# Patient Record
Sex: Male | Born: 1950
Health system: Southern US, Community
[De-identification: ages and names within clinical notes are randomized; demographics above are authoritative.]

## PROBLEM LIST (undated history)

## (undated) DIAGNOSIS — M1990 Unspecified osteoarthritis, unspecified site: Secondary | ICD-10-CM

## (undated) DIAGNOSIS — E119 Type 2 diabetes mellitus without complications: Secondary | ICD-10-CM

## (undated) HISTORY — DX: Unspecified osteoarthritis, unspecified site: M19.90

## (undated) HISTORY — DX: Type 2 diabetes mellitus without complications: E11.9

## (undated) HISTORY — PX: TOTAL KNEE ARTHROPLASTY: SHX125

---

## 1997-10-07 ENCOUNTER — Ambulatory Visit (HOSPITAL_COMMUNITY): Admission: RE | Admit: 1997-10-07 | Discharge: 1997-10-07 | Payer: Self-pay | Admitting: Podiatry

## 1997-11-04 ENCOUNTER — Ambulatory Visit (HOSPITAL_COMMUNITY): Admission: RE | Admit: 1997-11-04 | Discharge: 1997-11-04 | Payer: Self-pay | Admitting: Podiatry

## 2002-08-16 ENCOUNTER — Emergency Department (HOSPITAL_COMMUNITY): Admission: EM | Admit: 2002-08-16 | Discharge: 2002-08-16 | Payer: Self-pay | Admitting: Emergency Medicine

## 2002-08-16 ENCOUNTER — Encounter: Payer: Self-pay | Admitting: Emergency Medicine

## 2003-08-23 ENCOUNTER — Ambulatory Visit (HOSPITAL_COMMUNITY): Admission: EM | Admit: 2003-08-23 | Discharge: 2003-08-24 | Payer: Self-pay | Admitting: Orthopedic Surgery

## 2009-05-25 ENCOUNTER — Emergency Department (HOSPITAL_COMMUNITY): Admission: EM | Admit: 2009-05-25 | Discharge: 2009-05-25 | Payer: Self-pay | Admitting: Emergency Medicine

## 2010-04-13 DIAGNOSIS — E119 Type 2 diabetes mellitus without complications: Secondary | ICD-10-CM

## 2010-04-13 HISTORY — DX: Type 2 diabetes mellitus without complications: E11.9

## 2010-04-30 ENCOUNTER — Inpatient Hospital Stay (HOSPITAL_COMMUNITY)
Admission: EM | Admit: 2010-04-30 | Discharge: 2010-05-05 | Payer: Self-pay | Source: Home / Self Care | Admitting: Emergency Medicine

## 2010-05-10 ENCOUNTER — Telehealth: Payer: Self-pay | Admitting: Family Medicine

## 2010-05-11 ENCOUNTER — Encounter: Payer: Self-pay | Admitting: *Deleted

## 2010-05-11 ENCOUNTER — Telehealth (INDEPENDENT_AMBULATORY_CARE_PROVIDER_SITE_OTHER): Payer: Self-pay | Admitting: *Deleted

## 2010-05-17 ENCOUNTER — Encounter: Admission: RE | Admit: 2010-05-17 | Discharge: 2010-07-13 | Payer: Self-pay | Source: Home / Self Care

## 2010-05-26 ENCOUNTER — Encounter: Payer: Self-pay | Admitting: Family Medicine

## 2010-05-28 ENCOUNTER — Ambulatory Visit: Payer: Self-pay | Admitting: Family Medicine

## 2010-05-28 DIAGNOSIS — E119 Type 2 diabetes mellitus without complications: Secondary | ICD-10-CM

## 2010-05-28 DIAGNOSIS — E1169 Type 2 diabetes mellitus with other specified complication: Secondary | ICD-10-CM | POA: Insufficient documentation

## 2010-06-28 ENCOUNTER — Encounter: Payer: Self-pay | Admitting: Family Medicine

## 2010-06-29 ENCOUNTER — Ambulatory Visit: Admission: RE | Admit: 2010-06-29 | Discharge: 2010-06-29 | Payer: Self-pay | Source: Home / Self Care

## 2010-06-29 ENCOUNTER — Encounter: Payer: Self-pay | Admitting: Family Medicine

## 2010-06-29 DIAGNOSIS — M25569 Pain in unspecified knee: Secondary | ICD-10-CM | POA: Insufficient documentation

## 2010-06-29 LAB — CONVERTED CEMR LAB
BUN: 15 mg/dL (ref 6–23)
CO2: 26 meq/L (ref 19–32)
Calcium: 9.7 mg/dL (ref 8.4–10.5)
Chloride: 104 meq/L (ref 96–112)
Creatinine, Ser: 1.01 mg/dL (ref 0.40–1.50)
Glucose, Bld: 129 mg/dL — ABNORMAL HIGH (ref 70–99)
Potassium: 4 meq/L (ref 3.5–5.3)
Sodium: 141 meq/L (ref 135–145)

## 2010-07-13 ENCOUNTER — Ambulatory Visit
Admission: RE | Admit: 2010-07-13 | Discharge: 2010-07-13 | Payer: Self-pay | Source: Home / Self Care | Attending: Family Medicine | Admitting: Family Medicine

## 2010-07-13 NOTE — Letter (Signed)
Summary: Generic Letter  [facility]  [address]   [place], [place] [address]   Phone: [phone]  Fax: [phone]    05/11/2010  Calvin Newton [address] [place], [place]  [address]  To whom it may concern,  The above-named patient may return to work without any restrictions. Please contact our office with any questions.         Sincerely,   Garen Grams LPN

## 2010-07-13 NOTE — Progress Notes (Signed)
Summary: phn msg  Phone Note Call from Patient Call back at Home Phone [phone] Call back at [phone]   Caller: Patient Summary of Call: just got out of hosp and wants to know if he can go back to work Initial call taken by: De Nurse,  May 10, 2010 9:58 AM  Follow-up for Phone Call        pt is calling again Follow-up by: De Nurse,  May 10, 2010 4:28 PM  Additional Follow-up for Phone Call Additional follow up Details #1::        Please let patient know he can return to work without any restrictions Additional Follow-up by: Everrett Coombe DO,  May 10, 2010 9:06 PM     Appended Document: phn msg called pt and lmvm to call back

## 2010-07-13 NOTE — Progress Notes (Signed)
Summary: Letter ASAP  Phone Note Call from Patient Call back at [phone]   Reason for Call: Talk to Nurse Summary of Call: pt came into the office asking for a letter stating he may return to work, needs to be on letterhead. pt needs this asap so he can return to work. Initial call taken by: Knox Royalty,  May 11, 2010 12:48 PM  Follow-up for Phone Call        letter done by asha and given to pt on 11/29 Follow-up by: Knox Royalty,  May 12, 2010 11:36 AM

## 2010-07-15 NOTE — Assessment & Plan Note (Signed)
Summary: NP-Hospital f/u DM   Vital Signs:  Patient profile:   60 year old male Weight:      268 pounds Temp:     97.8 degrees F oral Pulse rate:   81 / minute BP sitting:   110 / 79  (left arm) Cuff size:   large  Vitals Entered By: Tessie Fass CMA (May 28, 2010 2:35 PM) CC: hospital f/u Is Patient Diabetic? Yes   CC:  hospital f/u.  History of Present Illness: Patient here for hospital follow-up since being diagnosed with diabetes -Patient feeling much better since glucose under better control.  States he spilled his metformin and does not have anymore, so has not been taking for the past few days.  Patient states sugars range from 66-225 with average 150-170.  Doing well with insulin administration and sliding scale that was prescribed.  Patient currently does not see an eye doctor and not taking an daily aspirin.  Has not had any symptoms with his CBG in the 60's.  Denies shakiness or dizziness.  Decreased urination and thirst since diabetes under better control.  Current Medications (verified): 1)  Metformin Hcl 1000 Mg Tabs (Metformin Hcl) .... Take 1 Tablet By Mouth Two Times A Day 2)  Lantus 100 Unit/ml Soln (Insulin Glargine) .... 40 Units Qhs 3)  Novolog 100 Unit/ml Soln (Insulin Aspart) .... Give Per Sliding Scale 121-150 Give 2 Units, 151-200 Give 3 Units,201-250 Give 5 Units, 251-300 Give 8 Units, 301-350 Give 11 Units, 351-400 Give 15 Units 4)  Lisinopril 5 Mg Tabs (Lisinopril) .Marland Kitchen.. 1 Tab By Mouth Daily 5)  Aspirin 81 Mg Tbec (Aspirin) .Marland Kitchen.. 1 Tab Daily  Physical Exam  General:  Well-developed,well-nourished,in no acute distress; alert,appropriate and cooperative throughout examination Head:  Normocephalic and atraumatic without obvious abnormalities. No apparent alopecia or balding. Lungs:  Normal respiratory effort, chest expands symmetrically. Lungs are clear to auscultation, no crackles or wheezes. Heart:  Normal rate and regular rhythm. S1 and S2 normal  without gallop, murmur, click, rub or other extra sounds. Abdomen:  Bowel sounds positive,abdomen soft and non-tender without masses, organomegaly or hernias noted. Pulses:  R and L carotid,radial,femoral,dorsalis pedis and posterior tibial pulses are full and equal bilaterally Extremities:  No clubbing, cyanosis, edema, or deformity noted with normal full range of motion of all joints.     Impression & Recommendations:  Problem # 1:  DM (ICD-250.00) Pt. doing well with current insulin regimen, has had a couple episodes of low blood sugar, however asymptomatic.  Will decrease his lantus by 10 units since he has not been taking his metformin and get him restarted on metformin with his goal to eventually to be off insulin at some point.  Explained to him the importance of lifestyle changes if he wants to be off insulin.  Educated about what foods to eat and the importance of exercise.  Handouts given.  Also started on low dose ACE-I and baby asa.  Will do diabetic foot exam when he is seen again in one month.  His updated medication list for this problem includes:    Metformin Hcl 1000 Mg Tabs (Metformin hcl) .Marland Kitchen... Take 1 tablet by mouth two times a day    Lantus 100 Unit/ml Soln (Insulin glargine) .Marland KitchenMarland KitchenMarland KitchenMarland Kitchen 40 units qhs    Novolog 100 Unit/ml Soln (Insulin aspart) .Marland Kitchen... Give per sliding scale 121-150 give 2 units, 151-200 give 3 units,201-250 give 5 units, 251-300 give 8 units, 301-350 give 11 units, 351-400 give 15 units  Lisinopril 5 Mg Tabs (Lisinopril) .Marland Kitchen... 1 tab by mouth daily    Aspirin 81 Mg Tbec (Aspirin) .Marland Kitchen... 1 tab daily  Orders: Glucose Cap[facility] ([address])  Complete Medication List: 1)  Metformin Hcl 1000 Mg Tabs (Metformin hcl) .... Take 1 tablet by mouth two times a day 2)  Lantus 100 Unit/ml Soln (Insulin glargine) .... 40 units qhs 3)  Novolog 100 Unit/ml Soln (Insulin aspart) .... Give per sliding scale 121-150 give 2 units, 151-200 give 3 units,201-250 give 5 units, 251-300 give 8  units, 301-350 give 11 units, 351-400 give 15 units 4)  Lisinopril 5 Mg Tabs (Lisinopril) .Marland Kitchen.. 1 tab by mouth daily 5)  Aspirin 81 Mg Tbec (Aspirin) .Marland Kitchen.. 1 tab daily  Patient Instructions: 1)  It was good seeing you again today. 2)  I would like for you to continue on your metformin.  I want you to decrease the amount of lantus you are taking to 40units at night. 3)  Also I would like for you to make an appointment with an eye doctor when you get the opportunity for an eye exam for your diabetes.   4)  I would like to see you back in one month to see how your sugars are doing.  If they are running high >250 on a consistent basis come back to see me sooner. 5)  Also I would like for you to start taking a baby (81mg ) aspirin daily if you are not doing so already. 6)  I am also adding a low dose medication to protect your kidneys with diabetes, I will send this and your metformin prescription to the [facility] on [address].  Prescriptions: LISINOPRIL 5 MG TABS (LISINOPRIL) 1 tab by mouth daily  #30 x 3   Entered and Authorized by:   Everrett Coombe DO   Signed by:   Everrett Coombe DO on 05/28/2010   Method used:   Electronically to        [facility] [phone]* (retail)       [address]       [place], [place]  [address]       Ph: [phone]       Fax: [phone]   RxID:   [id number]    Orders Added: 1)  Glucose Cap[facility] [66440] 2)  [facility] [[address]]  Appended Document: NP-Hospital f/u DM     Current Medications (verified): 1)  Metformin Hcl 1000 Mg Tabs (Metformin Hcl) .... Take 1 Tablet By Mouth Two Times A Day 2)  Lantus 100 Unit/ml Soln (Insulin Glargine) .... 40 Units Qhs 3)  Novolog 100 Unit/ml Soln (Insulin Aspart) .... Give Per Sliding Scale 121-150 Give 2 Units, 151-200 Give 3 Units,201-250 Give 5 Units, 251-300 Give 8 Units, 301-350 Give 11 Units, 351-400 Give 15 Units 4)  Lisinopril 5 Mg Tabs (Lisinopril) .Marland Kitchen.. 1 Tab By Mouth Daily 5)  Aspirin 81 Mg  Tbec (Aspirin) .Marland Kitchen.. 1 Tab Daily  Allergies (verified): No Known Drug Allergies  Past History:  Past Medical History: DMII- 04/2010  Past Surgical History: L Knee replacement  Family History: Mother died at age 78 father died at age 59  Social History: Does not use tobacco, EtOH, or Drugs.  Employed by [employer] and is exposed to the sun frequently   Complete Medication List: 1)  Metformin Hcl 1000 Mg Tabs (Metformin hcl) .... Take 1 tablet by mouth two times a day 2)  Lantus 100 Unit/ml Soln (Insulin glargine) .Marland KitchenMarland KitchenMarland Kitchen  40 units qhs 3)  Novolog 100 Unit/ml Soln (Insulin aspart) .... Give per sliding scale 121-150 give 2 units, 151-200 give 3 units,201-250 give 5 units, 251-300 give 8 units, 301-350 give 11 units, 351-400 give 15 units 4)  Lisinopril 5 Mg Tabs (Lisinopril) .Marland Kitchen.. 1 tab by mouth daily 5)  Aspirin 81 Mg Tbec (Aspirin) .Marland Kitchen.. 1 tab daily

## 2010-07-15 NOTE — Miscellaneous (Signed)
  Clinical Lists Changes  Medications: Added new medication of METFORMIN HCL 1000 MG TABS (METFORMIN HCL) Take 1 tablet by mouth two times a day - Signed Rx of METFORMIN HCL 1000 MG TABS (METFORMIN HCL) Take 1 tablet by mouth two times a day;  #60 x 1;  Signed;  Entered by: Rochele Pages RN;  Authorized by: Everrett Coombe DO;  Method used: Electronically to [facility] [phone]*, [address], [place], [place]  [address], Ph: [phone], Fax: [phone]    Prescriptions: METFORMIN HCL 1000 MG TABS (METFORMIN HCL) Take 1 tablet by mouth two times a day  #60 x 1   Entered by:   Rochele Pages RN   Authorized by:   Everrett Coombe DO   Signed by:   Rochele Pages RN on 05/26/2010   Method used:   Electronically to        [facility] [phone]* (retail)       [address]       [place], [place]  [address]       Ph: [phone]       Fax: [phone]   RxID:   [id number]   Recvd refill request, this med is on pt's discharge summary from the hospital.  Discussed with Dr. [place]- she advised it is ok to send rx.  Rochele Pages RN  May 26, 2010 12:13 PM

## 2010-07-15 NOTE — Miscellaneous (Signed)
Summary: Orders Update  Clinical Lists Changes  Orders: Added new Test order of Basic Met-[facility] ([id number]) - Signed 

## 2010-07-19 ENCOUNTER — Encounter: Payer: Self-pay | Admitting: *Deleted

## 2010-07-21 NOTE — Assessment & Plan Note (Signed)
Summary: F/u DM   Vital Signs:  Patient profile:   60 year old male Weight:      259 pounds Temp:     98.9 degrees F BP sitting:   120 / 82  (right arm) Cuff size:   large  Vitals Entered By: Tessie Fass CMA (June 29, 2010 3:30 PM) CC: F/U DM Is Patient Diabetic? Yes Pain Assessment Patient in pain? no        Diabetic Foot Exam Foot Inspection Is there a history of a foot ulcer?              No Is there a foot ulcer now?              No Is there swelling or an abnormal foot shape?          No Are the toenails long?                Yes Are the toenails thick?                Yes Are the toenails ingrown?              No Is there heavy callous build-up?              No Is there pain in the calf muscle (Intermittent claudication) when walking?    NoIs there a claw toe deformity?              No Is there elevated skin temperature?            No Is there limited ankle dorsiflexion?            No Is there foot or ankle muscle weakness?            No  Diabetic Foot Care Education Patient educated on appropriate care of diabetic feet.  Pulse Check          Right Foot          Left Foot Posterior Tibial:        normal            normal Dorsalis Pedis:        normal            normal Comments: Nails thickened, dark and cracking High Risk Feet? No   10-g (5.07) Semmes-Weinstein Monofilament Test           Right Foot          Left Foot Visual Inspection     normal          Test Control      normal         normal Site 1         normal         normal Site 4         normal         normal Site 5         normal         normal Site 6         normal         normal Site 7         normal         normal Site 8         normal         normal Site 9         normal  normal  Impression      normal         normal   Primary Provider:  Everrett Coombe DO  CC:  F/U DM.  History of Present Illness: Pt. here for f/u  of DM as well as knee pain  1. DM:  Continuing to do well,  sugars averaging 140-150.  No episodes of hypoglycemic symptoms including feeling shaky, confusion, feeling tired.  Has increased exercise currently does one hour of exercise nightly divided into 20 min on stationary bike, 20 minutes on elliptical, and 20 min of free weights. Also continues to improve diet choices and eating more fruits and vegetables.  Has lost 9 lbs since last visit.  No problems with insulin administration at this time but goal is to come off of insulin.  Denies chest pain, shortness of breath, headache, nausea, vomiting or diarrhea.   Taking ACE-I without problems.   2. Knee Pain:  Knee pain is better since weight loss and exercising more.  Is using a muscle rub that seems to help quite a bit.  X-rays done in hospital negative for any bony abnormalities other than minor arthritis.   Current Medications (verified): 1)  Metformin Hcl 1000 Mg Tabs (Metformin Hcl) .... Take 1 Tablet By Mouth Two Times A Day 2)  Lantus 100 Unit/ml Soln (Insulin Glargine) .... 40 Units Qhs 3)  Novolog 100 Unit/ml Soln (Insulin Aspart) .... Give Per Sliding Scale 121-150 Give 2 Units, 151-200 Give 3 Units,201-250 Give 5 Units, 251-300 Give 8 Units, 301-350 Give 11 Units, 351-400 Give 15 Units 4)  Lisinopril 5 Mg Tabs (Lisinopril) .Marland Kitchen.. 1 Tab By Mouth Daily 5)  Aspirin 81 Mg Tbec (Aspirin) .Marland Kitchen.. 1 Tab Daily  Allergies (verified): No Known Drug Allergies  Social History: Does not use tobacco, EtOH, or Drugs.  Employed by [employer] and is exposed to the sun frequently.  Exercising more, 60 minutes total of bike, elliptical and free weights nightly  Review of Systems       Pertinent positives and negatives noted in HPI, Vitals signs noted   Physical Exam  General:  Obese, nad Head:  Normocephalic and atraumatic without obvious abnormalities. Mouth:  Oral mucosa and oropharynx without lesions or exudates.  Teeth in good repair. Lungs:  Normal respiratory effort, chest expands  symmetrically. Lungs are clear to auscultation, no crackles or wheezes. Heart:  Normal rate and regular rhythm. S1 and S2 normal without gallop, murmur, click, rub or other extra sounds. Abdomen:  Bowel sounds positive,abdomen soft and non-tender without masses, organomegaly or hernias noted. Msk:  Knees w/o swelling or ttp.  No crepitus or laxity. Skin:  No rash or suspicious lesions  Diabetes Management Exam:    Foot Exam (with socks and/or shoes not present):       Sensory-Monofilament:          Left foot: normal          Right foot: normal   Impression & Recommendations:  Problem # 1:  DM (ICD-250.00) Assessment Improved Doing well, sugars better controlled, no hypoglycemic symptoms.  Encouraged to continue with daily exercise and healthy food decisions.  Doing great with weight loss.  Toenails with onycholysis recommended  applications of vicks nightly for now.  Will recheck and lipids at next visit.   His updated medication list for this problem includes:    Metformin Hcl 1000 Mg Tabs (Metformin hcl) .Marland Kitchen... Take 1 tablet by mouth two times a day    Lantus 100  Unit/ml Soln (Insulin glargine) .Marland KitchenMarland KitchenMarland KitchenMarland Kitchen 40 units qhs    Novolog 100 Unit/ml Soln (Insulin aspart) .Marland Kitchen... Give per sliding scale 121-150 give 2 units, 151-200 give 3 units,201-250 give 5 units, 251-300 give 8 units, 301-350 give 11 units, 351-400 give 15 units    Lisinopril 5 Mg Tabs (Lisinopril) .Marland Kitchen... 1 tab by mouth daily    Aspirin 81 Mg Tbec (Aspirin) .Marland Kitchen... 1 tab daily  Orders: [facility]- Est Level  3 ([address])Future Orders: Lipid-[facility] ([id number]) ... 09/11/2010 A1C-[facility] (14782) ... 09/11/2010  Problem # 2:  KNEE PAIN, RIGHT (ICD-719.46) Assessment: Improved Improved, encouraged to continue with weight loss and exercise.  May take anti-inflammatory (motrin, etc) if needed. His updated medication list for this problem includes:    Aspirin 81 Mg Tbec (Aspirin) .Marland Kitchen... 1 tab daily  Orders: [facility]- Est Level  3 ([address])  Complete  Medication List: 1)  Metformin Hcl 1000 Mg Tabs (Metformin hcl) .... Take 1 tablet by mouth two times a day 2)  Lantus 100 Unit/ml Soln (Insulin glargine) .... 40 units qhs 3)  Novolog 100 Unit/ml Soln (Insulin aspart) .... Give per sliding scale 121-150 give 2 units, 151-200 give 3 units,201-250 give 5 units, 251-300 give 8 units, 301-350 give 11 units, 351-400 give 15 units 4)  Lisinopril 5 Mg Tabs (Lisinopril) .Marland Kitchen.. 1 tab by mouth daily 5)  Aspirin 81 Mg Tbec (Aspirin) .Marland Kitchen.. 1 tab daily  Patient Instructions: 1)  Nice seeing you again today.  I would like to see you again in one month to check your A1C again.  Also I would like to check your lipids again.  I will put the order in and you can come in one morning before your next appointment before you have eaten.  Keep up the good work with the exercise.  Your weight is looking better and your sugars look good.  Hopefully we can cut back on the insulin after we check your A1C.  For your toenails, you can apply vicks vapor rub to them nightly and put a sock on over top.  This may take a few months to improve.  I will see you again in one month.  take care.   Orders Added: 1)  [facility]- Est Level  3 [99213] 2)  Lipid-[facility] [[id number]] 3)  A1C-[facility] [83036]    Prevention & Chronic Care Immunizations   Influenza vaccine: Not documented    Tetanus booster: Not documented    Pneumococcal vaccine: Not documented  Colorectal Screening   Hemoccult: Not documented    Colonoscopy: Not documented  Other Screening   PSA: Not documented   Smoking status: Not documented  Diabetes Mellitus   HgbA1C: Not documented   Hemoglobin A1C due: 08/27/2010    Eye exam: Not documented    Foot exam: yes  (06/29/2010)   Foot exam action/deferral: Do today   High risk foot: No  (06/29/2010)   Foot care education: Done  (06/29/2010)    Urine microalbumin/creatinine ratio: Not documented    Diabetes flowsheet reviewed?: Yes  Lipids   Total Cholesterol:  Not documented   LDL: Not documented   LDL Direct: Not documented   HDL: Not documented   Triglycerides: Not documented  Self-Management Support :    Diabetes self-management support: Not documented   Nursing Instructions: Diabetic foot exam today

## 2010-07-26 ENCOUNTER — Other Ambulatory Visit: Payer: Self-pay | Admitting: *Deleted

## 2010-07-26 ENCOUNTER — Other Ambulatory Visit (INDEPENDENT_AMBULATORY_CARE_PROVIDER_SITE_OTHER): Payer: BC Managed Care – PPO

## 2010-07-26 DIAGNOSIS — E119 Type 2 diabetes mellitus without complications: Secondary | ICD-10-CM

## 2010-07-26 LAB — LIPID PANEL
LDL Cholesterol: 111 mg/dL — ABNORMAL HIGH (ref 0–99)
Triglycerides: 88 mg/dL (ref <150)
VLDL: 18 mg/dL (ref 0–40)

## 2010-07-26 MED ORDER — INSULIN PEN NEEDLE 29G X 13MM MISC
1.0000 | Freq: Three times a day (TID) | Status: DC
Start: 2010-07-26 — End: 2010-07-26

## 2010-07-26 MED ORDER — INSULIN PEN NEEDLE 29G X 13MM MISC: 1.0000 | Freq: Four times a day (QID) | Status: DC

## 2010-07-30 ENCOUNTER — Ambulatory Visit (INDEPENDENT_AMBULATORY_CARE_PROVIDER_SITE_OTHER): Payer: BC Managed Care – PPO | Admitting: Family Medicine

## 2010-07-30 ENCOUNTER — Encounter: Payer: Self-pay | Admitting: Family Medicine

## 2010-07-30 VITALS — BP 104/66 | HR 81 | Temp 98.1°F | Wt 256.0 lb

## 2010-07-30 DIAGNOSIS — E119 Type 2 diabetes mellitus without complications: Secondary | ICD-10-CM

## 2010-07-30 DIAGNOSIS — E781 Pure hyperglyceridemia: Secondary | ICD-10-CM

## 2010-07-30 NOTE — Patient Instructions (Signed)
It was good seeing you again today.  For your insulin, I would like for you to stop taking your novolog.  I would also like for you to reduce your lantus to 30 units before bed.  I know your goal is to get off insulin so we will try to continue and reduce your dosage as tolerated.  Remember to keep a good record of your sugars and bring them with you when you come back.  I would like to see you back in 3-4 weeks.

## 2010-08-03 ENCOUNTER — Encounter: Payer: Self-pay | Admitting: Family Medicine

## 2010-08-03 DIAGNOSIS — E781 Pure hyperglyceridemia: Secondary | ICD-10-CM | POA: Insufficient documentation

## 2010-08-03 NOTE — Progress Notes (Signed)
SUBJECTIVE: 60 y.o. male for follow up of diabetes. Diabetic Review of Systems - medication compliance: compliant all of the time, diabetic diet compliance: compliant all of the time, home glucose monitoring: is performed regularly, is getting numbers in the low 100s and occasionally down in the 80's, no symptoms of hypoglycemia including sweating, jitteriness.  Is continuing to work out after work ~1-1.5 hours doing stationary bike, elliptical and free weights.  Continues to make dietary changes as well, has incorporated more vegetables into his diet and is trying to avoid sweets and processed carbs    OBJECTIVE: Appearance: alert, well appearing, and in no distress, oriented to person, place, and time, overweight and well hydrated. BP 104/66  Pulse 81  Temp(Src) 98.1 F (36.7 C) (Oral)  Wt 256 lb (116.121 kg)  Exam: heart sounds normal rate, regular rhythm, normal S1, S2, no murmurs, rubs, clicks or gallops, chest clear, no hepatosplenomegaly   .

## 2010-08-03 NOTE — Assessment & Plan Note (Addendum)
ASSESSMENT: Diabetes Mellitus: a1c shows much better controlled and getting some low numbers and not requiring SSI anymore, will d/c.  Will decrease lantus to 30 units, and have him monitor his sugar over the next month to see if we can continue to decrease.  As noted before, goal is to possibly get off insulin.  Patient working Midwife with diet and exercise to achieve this, explained to him that may not be possible but will continue to monitor progess

## 2010-08-03 NOTE — Assessment & Plan Note (Signed)
Much improved on last labwork, likely 2/2 to uncontrolled diabetes previously.

## 2010-08-03 NOTE — Progress Notes (Deleted)
  Subjective:    Patient ID: Calvin Newton, male    DOB: 04/29/1951, 60 y.o.   MRN: [id number]  Diabetes      Review of Systems     Objective:   Physical Exam        Assessment & Plan:

## 2010-08-17 ENCOUNTER — Telehealth: Payer: Self-pay | Admitting: Family Medicine

## 2010-08-17 NOTE — Telephone Encounter (Signed)
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 Please advise.

## 2010-08-18 NOTE — Telephone Encounter (Signed)
Please call patient and have them make an appointment with me with me to see if we can get him transitioned to orals.  Thanks

## 2010-08-19 ENCOUNTER — Telehealth: Payer: Self-pay | Admitting: *Deleted

## 2010-08-19 NOTE — Telephone Encounter (Signed)
Pt has upcoming appt tomorrow to discuss change of medications. Calvin Newton

## 2010-08-19 NOTE — Telephone Encounter (Signed)
Called pt and lmvm to call us back. Waiting for call back. Pt needs OV to discuss medication change Calvin Newton

## 2010-08-20 ENCOUNTER — Encounter: Payer: Self-pay | Admitting: Family Medicine

## 2010-08-20 ENCOUNTER — Ambulatory Visit (INDEPENDENT_AMBULATORY_CARE_PROVIDER_SITE_OTHER): Payer: BC Managed Care – PPO | Admitting: Family Medicine

## 2010-08-20 ENCOUNTER — Other Ambulatory Visit: Payer: Self-pay | Admitting: Family Medicine

## 2010-08-20 VITALS — BP 104/70 | Temp 97.9°F | Ht 71.0 in | Wt 255.0 lb

## 2010-08-20 DIAGNOSIS — E119 Type 2 diabetes mellitus without complications: Secondary | ICD-10-CM

## 2010-08-20 MED ORDER — GLIPIZIDE 10 MG PO TABS: 10.0000 mg | ORAL_TABLET | Freq: Two times a day (BID) | ORAL | Status: DC

## 2010-08-20 MED ORDER — SITAGLIPTIN PHOSPHATE 100 MG PO TABS: 100.0000 mg | ORAL_TABLET | Freq: Every day | ORAL | Status: DC

## 2010-08-20 NOTE — Patient Instructions (Addendum)
I have sent two prescriptions to your pharmacy for your diabetes.  I want you to stop your lantus.  If you have symptoms of low blood sugar including weakness, sweating, tremor or not feeling well check your sugar and if it is low drink a glass of orange juice or regular soda.  Recheck your sugar in 1 hour.

## 2010-08-23 ENCOUNTER — Telehealth: Payer: Self-pay | Admitting: Family Medicine

## 2010-08-23 DIAGNOSIS — E119 Type 2 diabetes mellitus without complications: Secondary | ICD-10-CM

## 2010-08-23 LAB — GLUCOSE, CAPILLARY: Glucose-Capillary: 100 mg/dL — ABNORMAL HIGH (ref 70–99)

## 2010-08-23 MED ORDER — GLIPIZIDE 10 MG PO TABS: 10.0000 mg | ORAL_TABLET | Freq: Every day | ORAL | Status: DC

## 2010-08-23 NOTE — Telephone Encounter (Signed)
His BS is now 48 and is needing to know what to do.  Was told to drinks & eat peanut butter.

## 2010-08-23 NOTE — Telephone Encounter (Signed)
Called patient and left message to take glipizide once daily instead of twice daily.  Told to call if continuing to have lows or any other questions

## 2010-08-24 LAB — CBC
HCT: 35.1 % — ABNORMAL LOW (ref 39.0–52.0)
HCT: 35.6 % — ABNORMAL LOW (ref 39.0–52.0)
HCT: 36.6 % — ABNORMAL LOW (ref 39.0–52.0)
HCT: 45.1 % (ref 39.0–52.0)
Hemoglobin: 12.6 g/dL — ABNORMAL LOW (ref 13.0–17.0)
Hemoglobin: 15.8 g/dL (ref 13.0–17.0)
MCH: 29.3 pg (ref 26.0–34.0)
MCH: 29.5 pg (ref 26.0–34.0)
MCH: 29.8 pg (ref 26.0–34.0)
MCH: 30 pg (ref 26.0–34.0)
MCHC: 33.1 g/dL (ref 30.0–36.0)
MCHC: 33.6 g/dL (ref 30.0–36.0)
MCHC: 34.4 g/dL (ref 30.0–36.0)
MCV: 86.2 fL (ref 78.0–100.0)
MCV: 86.3 fL (ref 78.0–100.0)
MCV: 86.7 fL (ref 78.0–100.0)
Platelets: 103 10*3/uL — ABNORMAL LOW (ref 150–400)
Platelets: 195 10*3/uL (ref 150–400)
Platelets: 231 10*3/uL (ref 150–400)
RBC: 4.6 MIL/uL (ref 4.22–5.81)
RBC: 5.2 MIL/uL (ref 4.22–5.81)
RDW: 13.2 % (ref 11.5–15.5)
RDW: 13.4 % (ref 11.5–15.5)
RDW: 13.5 % (ref 11.5–15.5)
RDW: 13.6 % (ref 11.5–15.5)
WBC: 11 10*3/uL — ABNORMAL HIGH (ref 4.0–10.5)
WBC: 5.3 10*3/uL (ref 4.0–10.5)
WBC: 7.9 10*3/uL (ref 4.0–10.5)

## 2010-08-24 LAB — BASIC METABOLIC PANEL
BUN: 10 mg/dL (ref 6–23)
BUN: 17 mg/dL (ref 6–23)
BUN: 19 mg/dL (ref 6–23)
BUN: 19 mg/dL (ref 6–23)
BUN: 7 mg/dL (ref 6–23)
BUN: 8 mg/dL (ref 6–23)
BUN: 8 mg/dL (ref 6–23)
BUN: 9 mg/dL (ref 6–23)
CO2: 22 mEq/L (ref 19–32)
CO2: 24 mEq/L (ref 19–32)
CO2: 25 mEq/L (ref 19–32)
CO2: 25 mEq/L (ref 19–32)
CO2: 25 mEq/L (ref 19–32)
CO2: 25 mEq/L (ref 19–32)
CO2: 26 mEq/L (ref 19–32)
CO2: 26 mEq/L (ref 19–32)
Calcium: 10 mg/dL (ref 8.4–10.5)
Calcium: 8.3 mg/dL — ABNORMAL LOW (ref 8.4–10.5)
Calcium: 8.5 mg/dL (ref 8.4–10.5)
Calcium: 8.6 mg/dL (ref 8.4–10.5)
Calcium: 8.6 mg/dL (ref 8.4–10.5)
Calcium: 8.7 mg/dL (ref 8.4–10.5)
Calcium: 8.7 mg/dL (ref 8.4–10.5)
Calcium: 8.8 mg/dL (ref 8.4–10.5)
Calcium: 8.9 mg/dL (ref 8.4–10.5)
Calcium: 9.7 mg/dL (ref 8.4–10.5)
Chloride: 101 mEq/L (ref 96–112)
Chloride: 103 mEq/L (ref 96–112)
Chloride: 103 mEq/L (ref 96–112)
Chloride: 103 mEq/L (ref 96–112)
Creatinine, Ser: 0.67 mg/dL (ref 0.4–1.5)
Creatinine, Ser: 0.68 mg/dL (ref 0.4–1.5)
Creatinine, Ser: 0.7 mg/dL (ref 0.4–1.5)
Creatinine, Ser: 0.72 mg/dL (ref 0.4–1.5)
Creatinine, Ser: 0.76 mg/dL (ref 0.4–1.5)
Creatinine, Ser: 0.8 mg/dL (ref 0.4–1.5)
Creatinine, Ser: 0.85 mg/dL (ref 0.4–1.5)
Creatinine, Ser: 0.98 mg/dL (ref 0.4–1.5)
Creatinine, Ser: 1.04 mg/dL (ref 0.4–1.5)
Creatinine, Ser: 1.14 mg/dL (ref 0.4–1.5)
GFR calc Af Amer: >60 mL/min (ref >60)
GFR calc Af Amer: >60 mL/min (ref >60)
GFR calc Af Amer: >60 mL/min (ref >60)
GFR calc Af Amer: >60 mL/min (ref >60)
GFR calc Af Amer: >60 mL/min (ref >60)
GFR calc Af Amer: >60 mL/min (ref >60)
GFR calc Af Amer: >60 mL/min (ref >60)
GFR calc non Af Amer: 59 mL/min — ABNORMAL LOW (ref >60)
GFR calc non Af Amer: >60 mL/min (ref >60)
GFR calc non Af Amer: >60 mL/min (ref >60)
GFR calc non Af Amer: >60 mL/min (ref >60)
GFR calc non Af Amer: >60 mL/min (ref >60)
GFR calc non Af Amer: >60 mL/min (ref >60)
GFR calc non Af Amer: >60 mL/min (ref >60)
GFR calc non Af Amer: >60 mL/min (ref >60)
GFR calc non Af Amer: >60 mL/min (ref >60)
GFR calc non Af Amer: >60 mL/min (ref >60)
GFR calc non Af Amer: >60 mL/min (ref >60)
Glucose, Bld: 137 mg/dL — ABNORMAL HIGH (ref 70–99)
Glucose, Bld: 143 mg/dL — ABNORMAL HIGH (ref 70–99)
Glucose, Bld: 220 mg/dL — ABNORMAL HIGH (ref 70–99)
Glucose, Bld: 230 mg/dL — ABNORMAL HIGH (ref 70–99)
Glucose, Bld: 235 mg/dL — ABNORMAL HIGH (ref 70–99)
Glucose, Bld: 322 mg/dL — ABNORMAL HIGH (ref 70–99)
Glucose, Bld: 342 mg/dL — ABNORMAL HIGH (ref 70–99)
Glucose, Bld: 368 mg/dL — ABNORMAL HIGH (ref 70–99)
Potassium: 3.5 mEq/L (ref 3.5–5.1)
Potassium: 3.7 mEq/L (ref 3.5–5.1)
Potassium: 4 mEq/L (ref 3.5–5.1)
Potassium: 4.8 mEq/L (ref 3.5–5.1)
Sodium: 130 mEq/L — ABNORMAL LOW (ref 135–145)
Sodium: 130 mEq/L — ABNORMAL LOW (ref 135–145)
Sodium: 130 mEq/L — ABNORMAL LOW (ref 135–145)
Sodium: 132 mEq/L — ABNORMAL LOW (ref 135–145)
Sodium: 132 mEq/L — ABNORMAL LOW (ref 135–145)
Sodium: 133 mEq/L — ABNORMAL LOW (ref 135–145)
Sodium: 135 mEq/L (ref 135–145)
Sodium: 137 mEq/L (ref 135–145)

## 2010-08-24 LAB — GLUCOSE, CAPILLARY
Glucose-Capillary: 142 mg/dL — ABNORMAL HIGH (ref 70–99)
Glucose-Capillary: 145 mg/dL — ABNORMAL HIGH (ref 70–99)
Glucose-Capillary: 146 mg/dL — ABNORMAL HIGH (ref 70–99)
Glucose-Capillary: 165 mg/dL — ABNORMAL HIGH (ref 70–99)
Glucose-Capillary: 167 mg/dL — ABNORMAL HIGH (ref 70–99)
Glucose-Capillary: 183 mg/dL — ABNORMAL HIGH (ref 70–99)
Glucose-Capillary: 191 mg/dL — ABNORMAL HIGH (ref 70–99)
Glucose-Capillary: 192 mg/dL — ABNORMAL HIGH (ref 70–99)
Glucose-Capillary: 199 mg/dL — ABNORMAL HIGH (ref 70–99)
Glucose-Capillary: 200 mg/dL — ABNORMAL HIGH (ref 70–99)
Glucose-Capillary: 202 mg/dL — ABNORMAL HIGH (ref 70–99)
Glucose-Capillary: 209 mg/dL — ABNORMAL HIGH (ref 70–99)
Glucose-Capillary: 209 mg/dL — ABNORMAL HIGH (ref 70–99)
Glucose-Capillary: 212 mg/dL — ABNORMAL HIGH (ref 70–99)
Glucose-Capillary: 217 mg/dL — ABNORMAL HIGH (ref 70–99)
Glucose-Capillary: 218 mg/dL — ABNORMAL HIGH (ref 70–99)
Glucose-Capillary: 225 mg/dL — ABNORMAL HIGH (ref 70–99)
Glucose-Capillary: 226 mg/dL — ABNORMAL HIGH (ref 70–99)
Glucose-Capillary: 227 mg/dL — ABNORMAL HIGH (ref 70–99)
Glucose-Capillary: 231 mg/dL — ABNORMAL HIGH (ref 70–99)
Glucose-Capillary: 233 mg/dL — ABNORMAL HIGH (ref 70–99)
Glucose-Capillary: 236 mg/dL — ABNORMAL HIGH (ref 70–99)
Glucose-Capillary: 236 mg/dL — ABNORMAL HIGH (ref 70–99)
Glucose-Capillary: 236 mg/dL — ABNORMAL HIGH (ref 70–99)
Glucose-Capillary: 237 mg/dL — ABNORMAL HIGH (ref 70–99)
Glucose-Capillary: 242 mg/dL — ABNORMAL HIGH (ref 70–99)
Glucose-Capillary: 242 mg/dL — ABNORMAL HIGH (ref 70–99)
Glucose-Capillary: 248 mg/dL — ABNORMAL HIGH (ref 70–99)
Glucose-Capillary: 251 mg/dL — ABNORMAL HIGH (ref 70–99)
Glucose-Capillary: 254 mg/dL — ABNORMAL HIGH (ref 70–99)
Glucose-Capillary: 260 mg/dL — ABNORMAL HIGH (ref 70–99)
Glucose-Capillary: 263 mg/dL — ABNORMAL HIGH (ref 70–99)
Glucose-Capillary: 267 mg/dL — ABNORMAL HIGH (ref 70–99)
Glucose-Capillary: 270 mg/dL — ABNORMAL HIGH (ref 70–99)
Glucose-Capillary: 274 mg/dL — ABNORMAL HIGH (ref 70–99)
Glucose-Capillary: 278 mg/dL — ABNORMAL HIGH (ref 70–99)
Glucose-Capillary: 283 mg/dL — ABNORMAL HIGH (ref 70–99)
Glucose-Capillary: 286 mg/dL — ABNORMAL HIGH (ref 70–99)
Glucose-Capillary: 290 mg/dL — ABNORMAL HIGH (ref 70–99)
Glucose-Capillary: 291 mg/dL — ABNORMAL HIGH (ref 70–99)
Glucose-Capillary: 292 mg/dL — ABNORMAL HIGH (ref 70–99)
Glucose-Capillary: 294 mg/dL — ABNORMAL HIGH (ref 70–99)
Glucose-Capillary: 301 mg/dL — ABNORMAL HIGH (ref 70–99)
Glucose-Capillary: 308 mg/dL — ABNORMAL HIGH (ref 70–99)
Glucose-Capillary: 311 mg/dL — ABNORMAL HIGH (ref 70–99)
Glucose-Capillary: 312 mg/dL — ABNORMAL HIGH (ref 70–99)
Glucose-Capillary: 319 mg/dL — ABNORMAL HIGH (ref 70–99)
Glucose-Capillary: 326 mg/dL — ABNORMAL HIGH (ref 70–99)
Glucose-Capillary: 326 mg/dL — ABNORMAL HIGH (ref 70–99)
Glucose-Capillary: 329 mg/dL — ABNORMAL HIGH (ref 70–99)
Glucose-Capillary: 335 mg/dL — ABNORMAL HIGH (ref 70–99)
Glucose-Capillary: 336 mg/dL — ABNORMAL HIGH (ref 70–99)
Glucose-Capillary: 338 mg/dL — ABNORMAL HIGH (ref 70–99)
Glucose-Capillary: 339 mg/dL — ABNORMAL HIGH (ref 70–99)
Glucose-Capillary: 343 mg/dL — ABNORMAL HIGH (ref 70–99)
Glucose-Capillary: 349 mg/dL — ABNORMAL HIGH (ref 70–99)
Glucose-Capillary: 361 mg/dL — ABNORMAL HIGH (ref 70–99)
Glucose-Capillary: 362 mg/dL — ABNORMAL HIGH (ref 70–99)
Glucose-Capillary: 375 mg/dL — ABNORMAL HIGH (ref 70–99)
Glucose-Capillary: 414 mg/dL — ABNORMAL HIGH (ref 70–99)
Glucose-Capillary: 434 mg/dL — ABNORMAL HIGH (ref 70–99)
Glucose-Capillary: 505 mg/dL — ABNORMAL HIGH (ref 70–99)
Glucose-Capillary: 508 mg/dL — ABNORMAL HIGH (ref 70–99)
Glucose-Capillary: >600 mg/dL (ref 70–99)

## 2010-08-24 LAB — DIFFERENTIAL
Basophils Absolute: 0 10*3/uL (ref 0.0–0.1)
Basophils Absolute: 0 10*3/uL (ref 0.0–0.1)
Basophils Relative: 0 % (ref 0–1)
Basophils Relative: 1 % (ref 0–1)
Monocytes Absolute: 0.5 10*3/uL (ref 0.1–1.0)
Monocytes Absolute: 0.7 10*3/uL (ref 0.1–1.0)
Neutro Abs: 3.6 10*3/uL (ref 1.7–7.7)
Neutro Abs: 9.1 10*3/uL — ABNORMAL HIGH (ref 1.7–7.7)
Neutrophils Relative %: 60 % (ref 43–77)

## 2010-08-24 LAB — POCT I-STAT 3, VENOUS BLOOD GAS (G3P V)
Acid-base deficit: 8 mmol/L — ABNORMAL HIGH (ref 0.0–2.0)
Bicarbonate: 18.3 meq/L — ABNORMAL LOW (ref 20.0–24.0)
O2 Saturation: 65 %
TCO2: 20 mmol/L (ref 0–100)
pCO2, Ven: 40 mmHg — ABNORMAL LOW (ref 45.0–50.0)
pH, Ven: 7.268 (ref 7.250–7.300)
pO2, Ven: 38 mmHg (ref 30.0–45.0)

## 2010-08-24 LAB — POCT I-STAT, CHEM 8
BUN: 35 mg/dL — ABNORMAL HIGH (ref 6–23)
Calcium, Ion: 1.23 mmol/L (ref 1.12–1.32)
Chloride: 106 mEq/L (ref 96–112)
Creatinine, Ser: 1 mg/dL (ref 0.4–1.5)
Glucose, Bld: 546 mg/dL — ABNORMAL HIGH (ref 70–99)
HCT: 50 % (ref 39.0–52.0)
Hemoglobin: 17 g/dL (ref 13.0–17.0)
Potassium: 5.1 meq/L (ref 3.5–5.1)
Sodium: 131 meq/L — ABNORMAL LOW (ref 135–145)
TCO2: 17 mmol/L (ref 0–100)

## 2010-08-24 LAB — RAPID URINE DRUG SCREEN, HOSP PERFORMED
Amphetamines: NOT DETECTED
Barbiturates: NOT DETECTED
Benzodiazepines: NOT DETECTED
Cocaine: NOT DETECTED
Opiates: NOT DETECTED
Tetrahydrocannabinol: NOT DETECTED

## 2010-08-24 LAB — BASIC METABOLIC PANEL WITH GFR
BUN: 25 mg/dL — ABNORMAL HIGH (ref 6–23)
CO2: 15 meq/L — ABNORMAL LOW (ref 19–32)
Chloride: 102 meq/L (ref 96–112)
Creatinine, Ser: 1.26 mg/dL (ref 0.4–1.5)
Potassium: 5.1 meq/L (ref 3.5–5.1)

## 2010-08-24 LAB — URINE CULTURE
Colony Count: 75000
Culture  Setup Time: 201111191058

## 2010-08-24 LAB — URINALYSIS, ROUTINE W REFLEX MICROSCOPIC
Nitrite: NEGATIVE
Specific Gravity, Urine: 1.036 — ABNORMAL HIGH (ref 1.005–1.030)
Urobilinogen, UA: 0.2 mg/dL (ref 0.0–1.0)

## 2010-08-24 LAB — COMPREHENSIVE METABOLIC PANEL
Albumin: 3.9 g/dL (ref 3.5–5.2)
Alkaline Phosphatase: 127 U/L — ABNORMAL HIGH (ref 39–117)
BUN: 28 mg/dL — ABNORMAL HIGH (ref 6–23)
CO2: 16 mEq/L — ABNORMAL LOW (ref 19–32)
Chloride: 94 mEq/L — ABNORMAL LOW (ref 96–112)
GFR calc non Af Amer: >60 mL/min (ref >60)
Glucose, Bld: 503 mg/dL — ABNORMAL HIGH (ref 70–99)
Potassium: 4.9 mEq/L (ref 3.5–5.1)
Total Bilirubin: 1.1 mg/dL (ref 0.3–1.2)

## 2010-08-24 LAB — URINE MICROSCOPIC-ADD ON

## 2010-08-24 LAB — LIPID PANEL
HDL: 56 mg/dL (ref >39)
Total CHOL/HDL Ratio: 6.8 RATIO
VLDL: UNDETERMINED mg/dL (ref 0–40)

## 2010-08-24 LAB — HEPATITIS PANEL, ACUTE
HCV Ab: NEGATIVE
Hep A IgM: NEGATIVE
Hep B C IgM: NEGATIVE
Hepatitis B Surface Ag: NEGATIVE

## 2010-08-24 LAB — MRSA PCR SCREENING: MRSA by PCR: NEGATIVE

## 2010-08-24 LAB — HEMOGLOBIN A1C: Hgb A1c MFr Bld: 14.3 % — ABNORMAL HIGH (ref <5.7)

## 2010-08-25 ENCOUNTER — Telehealth: Payer: Self-pay | Admitting: Family Medicine

## 2010-08-25 NOTE — Telephone Encounter (Signed)
Next day sugar went to 123 and after that was 88 Pt is at [phone] if you need to talk to him

## 2010-08-25 NOTE — Telephone Encounter (Signed)
Called patient to see how he was doing with change in medication.  Left message for patient to call back and let me know how he was doing

## 2010-08-29 NOTE — Assessment & Plan Note (Addendum)
Requesting that medication be changed to oral meds.  Sugars have been well controlled on insulin and we have talked about eventually transitioning over to orals in the past as long as he continued with exercise and diet changes.  Has to be on orals now due to DOT physical, will d/c Lantus and start him on glipizde and [place].  Given warning signs of glucose being too low and instructed to call me back if he has any of these symptoms so that we can adjust dosage.  Will have him f/u in a couple weeks to see how he is doing on new regiment.  Instructed to record glucose daily until next appointment.

## 2010-08-29 NOTE — Progress Notes (Signed)
  Subjective:    Patient ID: BASIM BARTNIK, male    DOB: May 22, 1951, 60 y.o.   MRN: [id number]  Diabetes   Patient here to talk about switching over to oral diabetes meds and getting off insulin.  States he went to DOT physical and they told him that he should be on oral medications as insulin makes it difficult for him to pass DOT physical.  He states he is still exercising regularly and his sugars are running in the low 100's with many numbers <100.  He denies any symptoms of low blood sugar, chest pain, shakiness, headache.      Review of Systems     Objective:   Physical Exam  Constitutional: He appears well-developed and well-nourished. No distress.  HENT:  Head: Normocephalic and atraumatic.  Cardiovascular: Normal rate and regular rhythm.   Pulmonary/Chest: Effort normal and breath sounds normal.  Musculoskeletal: He exhibits no edema.

## 2010-08-31 ENCOUNTER — Other Ambulatory Visit: Payer: Self-pay | Admitting: *Deleted

## 2010-08-31 MED ORDER — METFORMIN HCL 1000 MG PO TABS: 1000.0000 mg | ORAL_TABLET | Freq: Two times a day (BID) | ORAL | Status: DC

## 2010-09-08 ENCOUNTER — Ambulatory Visit (INDEPENDENT_AMBULATORY_CARE_PROVIDER_SITE_OTHER): Payer: BC Managed Care – PPO | Admitting: Family Medicine

## 2010-09-08 ENCOUNTER — Encounter: Payer: Self-pay | Admitting: Family Medicine

## 2010-09-08 VITALS — BP 145/75 | HR 73 | Temp 98.2°F | Wt 252.0 lb

## 2010-09-08 DIAGNOSIS — E119 Type 2 diabetes mellitus without complications: Secondary | ICD-10-CM

## 2010-09-13 NOTE — Progress Notes (Signed)
  Subjective:    Patient ID: Calvin Newton, male    DOB: 1951/04/01, 60 y.o.   MRN: [id number]  HPI Patient here to follow up on diabetes since transitioning to oral hypoglycemic agents.  Tolerating well did have one low episode initially but glipizide changed and no more episodes of lows.  Blood glucose ranging from 80-130 on his meter, with only one outlier in the low 200s.  Still exercising regularly daily, and doing well with diet changes.   Review of Systems Denies diarrhea, chest pain, n/v, headache, vision change.    Objective:   Physical Exam  Constitutional: He appears well-developed and well-nourished. No distress.  Cardiovascular: Normal rate, regular rhythm and normal heart sounds.   Pulmonary/Chest: Effort normal and breath sounds normal.  Abdominal: Soft. Bowel sounds are normal. He exhibits no distension. There is no tenderness. There is no rebound.  Musculoskeletal: He exhibits no edema.          Assessment & Plan:  See A&P under problem list

## 2010-09-13 NOTE — Assessment & Plan Note (Addendum)
Doing well on current regimen will continue and have him back in 2 months for CPE and reccheck A1C

## 2010-09-14 LAB — WOUND CULTURE

## 2010-10-01 ENCOUNTER — Telehealth: Payer: Self-pay | Admitting: Family Medicine

## 2010-10-01 MED ORDER — LISINOPRIL 5 MG PO TABS: 5.0000 mg | ORAL_TABLET | Freq: Every day | ORAL | Status: DC

## 2010-10-01 MED ORDER — METFORMIN HCL 1000 MG PO TABS: 1000.0000 mg | ORAL_TABLET | Freq: Two times a day (BID) | ORAL | Status: DC

## 2010-10-01 NOTE — Telephone Encounter (Signed)
Pt checking status of rx for lisinopril & metformin before the weekend, Pt goes to [facility]/[address].

## 2010-10-01 NOTE — Telephone Encounter (Signed)
Rx for lisinopril and metformin sent to pharmacy.

## 2010-10-29 NOTE — Op Note (Signed)
NAME:  GEREMY, RISTER                        ACCOUNT NO.:  [id number]   MEDICAL RECORD NO.:  [id number]                   PATIENT TYPE:  OIB   LOCATION:  5007                                 FACILITY:  [facility]   PHYSICIAN:  Harvie Junior, M.D.                DATE OF BIRTH:  Jun 16, 1950   DATE OF PROCEDURE:  08/23/2003  DATE OF DISCHARGE:                                 OPERATIVE REPORT   PREOPERATIVE DIAGNOSIS:  Patellar tendon rupture, left.   POSTOPERATIVE DIAGNOSIS:  Patellar tendon rupture, left.   PRINCIPAL PROCEDURE:  Repair of patellar tendon with three Fibrewire  sutures, #5 centrally, #2 peripherally, through bone drill holes to the  patella.   SURGEON:  Harvie Junior, M.D.   ASSISTANT:  Marshia Ly, P.A.   ANESTHESIA:  General.   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  He was evaluated by  Urgent Care and sent over to evaluate.  At that time, he was noted to have a  big gap between his patella and patellar tendon, and he had a large gap on  the x-ray which showed that his patella was riding significantly superiorly.  At that point, we discussed options with the patient, an ultimately, he  elected to have this fixed which was the appropriate course of action.  He  was brought to the operating room for this procedure.   PROCEDURE:  The patient was brought to the operating room and after adequate  anesthesia was obtained with general anesthetic, the patient was placed upon  the operating table.  The left leg was prepped and draped in the usual  sterile fashion.  Following this, the leg was exsanguinated and the  tourniquet inflated to 350 mmHg.  Following this, a midline incision was  made through the subcutaneous tissue and carried down to the level of the  extensor mechanism.  This was clearly identified.  There was a dramatic  retinacular tear both medially and laterally.   At this point, a #5 Fibrewire  suture was passed in a [place] fashion up the central portion of the tendon,  a #2 medially and laterally.  These sutures were then held out of the way.  Four drill holes were then passed through the patella, and the sutures were  then passed sequentially to tie the patellar tendon back to the patella.  At  this point, prior to closure, the retinaculum was closed with a #1 Vicryl  medially and laterally and once the retinaculum had been closed, the  patellar tendon was advanced into the inferior surface which had been  roughened with the rongeur, and the sutures were tied with the knee held in  60 degrees of flexion.  At this point, the knee was put through a range of  motion and could easily come into 90  degrees of flexion without undue  tension.  At this point, the wound was copiously irrigated and suctioned  dry.  The incision was then closed with 0 and 2-0 Vicryl, and the skin with  skin staples.  Sterile compression was applied.  The patient was placed into  a knee immobilizer and was then taken to the recovery room where she was  noted to be in satisfactory condition.  Estimated blood loss for the  procedure was minimal.                                               Harvie Junior, M.D.   Ranae Plumber  D:  08/23/2003  T:  08/24/2003  Job:  [id number]

## 2011-02-02 ENCOUNTER — Other Ambulatory Visit: Payer: Self-pay | Admitting: Family Medicine

## 2011-02-03 NOTE — Telephone Encounter (Signed)
Refill request

## 2011-02-09 ENCOUNTER — Other Ambulatory Visit: Payer: Self-pay | Admitting: Family Medicine

## 2011-02-09 NOTE — Telephone Encounter (Signed)
Refill request

## 2011-03-08 ENCOUNTER — Other Ambulatory Visit: Payer: Self-pay | Admitting: Family Medicine

## 2011-03-09 NOTE — Telephone Encounter (Signed)
Refill request

## 2011-03-11 ENCOUNTER — Encounter: Payer: BC Managed Care – PPO | Admitting: Family Medicine

## 2011-03-12 ENCOUNTER — Other Ambulatory Visit: Payer: Self-pay | Admitting: Family Medicine

## 2011-03-13 NOTE — Telephone Encounter (Signed)
Refill request

## 2011-03-14 NOTE — Telephone Encounter (Signed)
Needs appt. Prior to next refill

## 2011-03-22 ENCOUNTER — Ambulatory Visit (INDEPENDENT_AMBULATORY_CARE_PROVIDER_SITE_OTHER): Payer: Managed Care, Other (non HMO) | Admitting: Family Medicine

## 2011-03-22 ENCOUNTER — Encounter: Payer: Self-pay | Admitting: Family Medicine

## 2011-03-22 VITALS — BP 111/73 | HR 76 | Temp 98.9°F | Ht 71.5 in | Wt 257.4 lb

## 2011-03-22 DIAGNOSIS — E119 Type 2 diabetes mellitus without complications: Secondary | ICD-10-CM

## 2011-03-22 DIAGNOSIS — Z23 Encounter for immunization: Secondary | ICD-10-CM

## 2011-03-22 NOTE — Progress Notes (Signed)
  Subjective:    Patient ID: Calvin Newton, male    DOB: Jan 30, 1951, 60 y.o.   MRN: [id number]  HPI 1.  DM:  Here to follow up on his diabetes.  Has been a while since his last visit but states he has been doing well.  Is still exercising quite a bit, doing cardio but doing mostly resistance training.  Has been checking sugars and getting numbers in the low 100's.  Highest recently is 195.  Has been getting some numbers as low as into the 60's.  Can not tell when his sugar is this low, usually low in the morning or after exercise.  Denies shakiness, palpitations, sweating.  Would like to come off one of his medications if possible.     Review of Systems Denies chest pain, headache, weakness, n/v/d    Objective:   Physical Exam  Constitutional: He is oriented to person, place, and time. He appears well-developed and well-nourished. No distress.  HENT:  Head: Normocephalic and atraumatic.  Cardiovascular: Normal rate, regular rhythm and normal heart sounds.  Exam reveals no gallop and no friction rub.   No murmur heard. Pulmonary/Chest: Effort normal and breath sounds normal. No respiratory distress.  Musculoskeletal: He exhibits no edema.  Neurological: He is alert and oriented to person, place, and time.  Skin: Skin is warm and dry.  Psychiatric: He has a normal mood and affect. His behavior is normal. Judgment and thought content normal.          Assessment & Plan:

## 2011-03-22 NOTE — Patient Instructions (Signed)
It was good seeing you today.  You are doing great with your diabetes!!  Keep up the exercising I think that has really helped you.  It think you can stop the januvia at this point. I would like to see you back in 3 months. If you notice that your sugars are starting to go up into the 200's or higher please give me a call back.

## 2011-03-28 NOTE — Assessment & Plan Note (Signed)
A1C of 5.6 indicates excellent glycemic control.  I think it may be appropriate to give him a trial off [place] for now, as he wants to take less medications..  Encouraged to keep log over the next two weeks and if he noticed his sugar creeping up to give me a call back.  He will continue his current diet and exercise program as this seems to be working well for him.

## 2011-04-11 ENCOUNTER — Other Ambulatory Visit: Payer: Self-pay | Admitting: Family Medicine

## 2011-04-12 NOTE — Telephone Encounter (Signed)
Refill request

## 2011-04-20 ENCOUNTER — Other Ambulatory Visit: Payer: Self-pay | Admitting: Family Medicine

## 2011-04-22 ENCOUNTER — Telehealth: Payer: Self-pay | Admitting: Family Medicine

## 2011-04-22 NOTE — Telephone Encounter (Signed)
Fwd. To Dr.Matthews for review. .Calvin Newton  

## 2011-04-22 NOTE — Telephone Encounter (Signed)
Mr. Calvin Newton would like to talk to Dr. Denyse Amass about Glucosamine for his joints and red rice yeast for Cholesterol, he wants to know if it is ok for him to use these with his Diabetes.

## 2011-04-26 NOTE — Telephone Encounter (Signed)
Those medications are ok for him to use.

## 2011-04-28 NOTE — Telephone Encounter (Signed)
Waiting for call back. See message. Please let pt know. Lorenda Hatchet, Renato Battles

## 2011-07-12 ENCOUNTER — Other Ambulatory Visit: Payer: Self-pay | Admitting: Family Medicine

## 2011-07-13 NOTE — Telephone Encounter (Signed)
Refill request

## 2011-08-01 ENCOUNTER — Ambulatory Visit (INDEPENDENT_AMBULATORY_CARE_PROVIDER_SITE_OTHER): Payer: Managed Care, Other (non HMO) | Admitting: Family Medicine

## 2011-08-01 ENCOUNTER — Encounter: Payer: Self-pay | Admitting: Family Medicine

## 2011-08-01 VITALS — BP 114/76 | HR 79 | Temp 97.8°F | Ht 72.0 in | Wt 249.5 lb

## 2011-08-01 DIAGNOSIS — E119 Type 2 diabetes mellitus without complications: Secondary | ICD-10-CM

## 2011-08-01 LAB — COMPREHENSIVE METABOLIC PANEL
Albumin: 4.5 g/dL (ref 3.5–5.2)
Alkaline Phosphatase: 67 U/L (ref 39–117)
BUN: 16 mg/dL (ref 6–23)
Calcium: 9.7 mg/dL (ref 8.4–10.5)
Chloride: 106 mEq/L (ref 96–112)
Glucose, Bld: 87 mg/dL (ref 70–99)
Potassium: 4.3 mEq/L (ref 3.5–5.3)

## 2011-08-01 LAB — LDL CHOLESTEROL, DIRECT: Direct LDL: 105 mg/dL — ABNORMAL HIGH

## 2011-08-01 MED ORDER — LOSARTAN POTASSIUM 25 MG PO TABS: 25.0000 mg | ORAL_TABLET | Freq: Every day | ORAL | Status: DC

## 2011-08-01 MED ORDER — METFORMIN HCL 1000 MG PO TABS: 1000.0000 mg | ORAL_TABLET | Freq: Two times a day (BID) | ORAL | Status: DC

## 2011-08-01 NOTE — Patient Instructions (Signed)
Thank you for coming in today, it was good to see you I am changing your lisinopril to a medication to losartan I would like for you to stop your glipizide, but continue to take your metformin Continue to keep up your exercise I will see you back in 3 months.  Please schedule a physical at that time

## 2011-08-04 NOTE — Assessment & Plan Note (Signed)
Still well controlled A1C 5.8, slightly higher than previous.  He is going to switch back to artificial sweetener for his beverages.  I think it is reasonable to d/c his glipizide.  He will watch suagars if running high, instructed him to restart.  He will f/u in 3 months.  Advised about working out in sweat suit so that he does not get dehydrated while on ARB.  Will check lipids, cmet today.

## 2011-08-04 NOTE — Progress Notes (Signed)
  Subjective:    Patient ID: Calvin Newton, male    DOB: 06-10-1951, 61 y.o.   MRN: [id number]  HPI  1. DM:  Here to f/u on DM.  Taking Metformin and glipizide.  Sugars averaging around 100.  Getting many in the 80s-90s.  Highest he has seen is in the 150's.  He is still doing heavy exercise 4-5 days per week and has lost and additional 9 lbs.  He has been working out in sweats to help him lose "water weight".  Would like to get off glipizide.   Review of Systems     Objective:   Physical Exam  Constitutional: He is oriented to person, place, and time. He appears well-nourished. No distress.  Neck: Neck supple.  Cardiovascular: Normal rate, regular rhythm and normal heart sounds.   No murmur heard. Pulmonary/Chest: Effort normal and breath sounds normal. No respiratory distress. He has no wheezes.  Musculoskeletal: He exhibits no edema.  Neurological: He is alert and oriented to person, place, and time.          Assessment & Plan:

## 2011-08-12 ENCOUNTER — Encounter: Payer: Self-pay | Admitting: Family Medicine

## 2011-11-15 ENCOUNTER — Ambulatory Visit (INDEPENDENT_AMBULATORY_CARE_PROVIDER_SITE_OTHER): Payer: Managed Care, Other (non HMO) | Admitting: Family Medicine

## 2011-11-15 ENCOUNTER — Encounter: Payer: Self-pay | Admitting: Family Medicine

## 2011-11-15 VITALS — BP 100/64 | HR 76 | Temp 97.9°F | Ht 72.0 in | Wt 243.0 lb

## 2011-11-15 DIAGNOSIS — E119 Type 2 diabetes mellitus without complications: Secondary | ICD-10-CM

## 2011-11-15 LAB — POCT GLYCOSYLATED HEMOGLOBIN (HGB A1C): Hemoglobin A1C: 6.4

## 2011-11-15 NOTE — Patient Instructions (Signed)
Thank you for coming in today, it was good to see you I would like for you to continue your metformin  I will see you back in 6 months You can try increasing your fiber or try a stool softener such as colace to see if that helps.

## 2011-11-17 NOTE — Progress Notes (Signed)
  Subjective:    Patient ID: Calvin Newton, male    DOB: 23-Sep-1950, 61 y.o.   MRN: [id number]  HPI  1. Follow diabetes: Compliant with his medications.  Continues to exercise and work our regular basis. Weight is down an additional 6 pounds from prior appointment. He is checking his sugars and typically they're around the 130 to 140 range in the morning. Typically in the evening and towards the end of the day his sugar is in the 90-100 range. Did recently take a trip for a couple weeks to [facility] and said that he did not follow his typical diet and  And he did eat quite a few starches during this trip with some occasional blood sugars over 200. He is back on track now. He denies any problems with his medications. He does not report any nausea, vomiting, chest pain, shortness of breath, increased thirst or urination.   Review of Systems  Per history of present illness    Objective:   Physical Exam  Constitutional: He is oriented to person, place, and time. He appears well-developed and well-nourished. No distress.  HENT:  Head: Normocephalic and atraumatic.  Eyes: Conjunctivae are normal. Pupils are equal, round, and reactive to light.  Neck: Neck supple.  Cardiovascular: Normal rate, regular rhythm and normal heart sounds.   Pulmonary/Chest: Effort normal and breath sounds normal.  Musculoskeletal: He exhibits no edema.  Neurological: He is alert and oriented to person, place, and time.          Assessment & Plan:

## 2011-11-17 NOTE — Assessment & Plan Note (Signed)
Hemoglobin A1c elevated from previous appointment. However he still has good control of his diabetes it seems like he is back on track with his diet an exercise regimen. I will make no changes to his medication regimen at this time.  I'll plan to have him back in 6 months for followup given that his diabetes is well controlled. Explain the need for eye exam plans to schedule. Foot exam done today

## 2011-12-02 ENCOUNTER — Other Ambulatory Visit: Payer: Self-pay | Admitting: *Deleted

## 2011-12-02 DIAGNOSIS — E119 Type 2 diabetes mellitus without complications: Secondary | ICD-10-CM

## 2011-12-05 MED ORDER — LOSARTAN POTASSIUM 25 MG PO TABS: 25.0000 mg | ORAL_TABLET | Freq: Every day | ORAL | Status: DC

## 2011-12-05 MED ORDER — METFORMIN HCL 1000 MG PO TABS: 1000.0000 mg | ORAL_TABLET | Freq: Two times a day (BID) | ORAL | Status: DC

## 2012-04-08 ENCOUNTER — Other Ambulatory Visit: Payer: Self-pay | Admitting: Family Medicine

## 2012-05-09 ENCOUNTER — Other Ambulatory Visit: Payer: Self-pay | Admitting: Family Medicine

## 2012-06-11 ENCOUNTER — Ambulatory Visit (INDEPENDENT_AMBULATORY_CARE_PROVIDER_SITE_OTHER): Payer: Managed Care, Other (non HMO) | Admitting: Family Medicine

## 2012-06-11 ENCOUNTER — Encounter: Payer: Self-pay | Admitting: Family Medicine

## 2012-06-11 VITALS — BP 119/78 | HR 69 | Ht 72.0 in | Wt 248.0 lb

## 2012-06-11 DIAGNOSIS — E669 Obesity, unspecified: Secondary | ICD-10-CM

## 2012-06-11 DIAGNOSIS — E119 Type 2 diabetes mellitus without complications: Secondary | ICD-10-CM

## 2012-06-11 DIAGNOSIS — R350 Frequency of micturition: Secondary | ICD-10-CM | POA: Insufficient documentation

## 2012-06-11 LAB — POCT GLYCOSYLATED HEMOGLOBIN (HGB A1C): Hemoglobin A1C: 6.4

## 2012-06-11 MED ORDER — LISINOPRIL 10 MG PO TABS: 10.0000 mg | ORAL_TABLET | Freq: Every day | ORAL | Status: DC

## 2012-06-11 NOTE — Patient Instructions (Addendum)
Thank you for coming in today, it was good to see you I will let you know the results of your urine Stop losartan and restart lisinopril Continue metformin, follow up with me in 6 months.

## 2012-06-13 DIAGNOSIS — E669 Obesity, unspecified: Secondary | ICD-10-CM | POA: Insufficient documentation

## 2012-06-13 NOTE — Progress Notes (Signed)
  Subjective:    Patient ID: Calvin Newton, male    DOB: 05/22/1951, 62 y.o.   MRN: [id number]  HPI  1.  DM:  Here for diabetes follow up. CHRONIC DIABETES  Disease Monitoring  Blood Sugar Ranges: 170-190 post prandial, around 100-120 fasting  Polyuria: yes, endorses some increased urination.  Thinks is related to using losartan.  Would  rather have cough with lisinopril  Visual problems: no   Medication Compliance: yes  Medication Side Effects  Hypoglycemia: no   Preventitive Health Care  Eye Exam: Planning on having soon  Foot Exam: Today  Diet pattern: Generally healthy, watches carbs and starchy foods  Exercise: Exercises 1-2 hours per day 5 days per week  2. Obesity:  Exercising frequently and eats pretty healthy.  Does endorse missing some meals occasionally, but is usually pretty consistent.  Feels like weight has been stable.   Review of Systems Per HPI    Objective:   Physical Exam  Constitutional:       Overweight male, nad   HENT:  Head: Normocephalic and atraumatic.  Eyes: Conjunctivae normal are normal.  Cardiovascular: Normal rate and regular rhythm.   Pulmonary/Chest: Effort normal and breath sounds normal. No respiratory distress.  Abdominal: Soft. He exhibits no distension. There is no tenderness.  Musculoskeletal: He exhibits no edema.          Assessment & Plan:

## 2012-06-13 NOTE — Assessment & Plan Note (Signed)
Urinary frequency also with some abdominal fullness.  Check UA.

## 2012-06-13 NOTE — Assessment & Plan Note (Signed)
A1c shows diabetes is well controlled.  Continue current medications.  Will change losartan back to lisinopril at patients request.  Advised continued healthy diet and exercise.  Schedule appt. With eye doctor.   Return in 6 months since has been well controlled.

## 2012-06-13 NOTE — Assessment & Plan Note (Signed)
Weight has been relatively stable.  Discussed continued exercise and healthy eating choices.

## 2012-07-02 ENCOUNTER — Telehealth: Payer: Self-pay | Admitting: Family Medicine

## 2012-07-02 NOTE — Telephone Encounter (Signed)
Called pt. Waiting for call back. (Sorry, but we did not do a UA at his last visit. We can do a UA at his next OV with [place].) .Calvin Newton

## 2012-07-02 NOTE — Telephone Encounter (Signed)
Patient had a urine sample taken back in December and hasn't heard back on the results so he is calling to find out what those results are.  It is ok to leave a message.

## 2012-07-03 NOTE — Telephone Encounter (Signed)
Called pt and advised to have UA done at his next OV. Pt agreed. Lorenda Hatchet, Renato Battles

## 2012-07-11 ENCOUNTER — Other Ambulatory Visit: Payer: Self-pay | Admitting: Family Medicine

## 2012-12-20 ENCOUNTER — Other Ambulatory Visit: Payer: Self-pay

## 2013-04-25 ENCOUNTER — Telehealth: Payer: Self-pay | Admitting: Family Medicine

## 2013-04-25 DIAGNOSIS — E119 Type 2 diabetes mellitus without complications: Secondary | ICD-10-CM

## 2013-04-25 NOTE — Telephone Encounter (Signed)
Will fwd. To PCP for review .Calvin Newton  

## 2013-04-25 NOTE — Telephone Encounter (Signed)
Mr. Marmolejos is scheduled for his yearly phys on 12/16 with provider.  Want a refill on his diabetes med Glipizide.  Was taking it prior to Metformin, but was told by previous provider that if he felt he needed to go back to it would re-activate.  Please send to [facility] on [address].  If there is a problem with this request, please contact patient.

## 2013-04-29 MED ORDER — GLIPIZIDE 5 MG PO TABS: 5.0000 mg | ORAL_TABLET | Freq: Two times a day (BID) | ORAL | Status: DC

## 2013-04-29 NOTE — Telephone Encounter (Signed)
Reviewed patient's record, as I have not seen patient in clinic yet. Previously well controlled DM, with increasing HgbA1c trend 5.8 to 6.4 (2013). Continues Metformin 1000mg  BID, previously on Glipizide 10mg  BID with meals.  Due to upcoming appointment with patient on 05/28/13, I will send in rx for Glipizide 5mg  take 1 tab BID with meals (#60, 0 refills) at patient's request for 1 month, will re-evaluate need for continuing at next appointment. Started lower dose at this time, may need to increase in future.  Saralyn Pilar, DO

## 2013-05-28 ENCOUNTER — Ambulatory Visit (INDEPENDENT_AMBULATORY_CARE_PROVIDER_SITE_OTHER): Payer: 59 | Admitting: Family Medicine

## 2013-05-28 ENCOUNTER — Encounter: Payer: Self-pay | Admitting: Family Medicine

## 2013-05-28 VITALS — BP 127/86 | HR 92 | Temp 98.1°F | Ht 72.0 in | Wt 277.0 lb

## 2013-05-28 DIAGNOSIS — E119 Type 2 diabetes mellitus without complications: Secondary | ICD-10-CM

## 2013-05-28 DIAGNOSIS — G8929 Other chronic pain: Secondary | ICD-10-CM | POA: Insufficient documentation

## 2013-05-28 DIAGNOSIS — R1031 Right lower quadrant pain: Secondary | ICD-10-CM

## 2013-05-28 HISTORY — DX: Other chronic pain: G89.29

## 2013-05-28 LAB — COMPREHENSIVE METABOLIC PANEL
AST: 36 U/L (ref 0–37)
Alkaline Phosphatase: 67 U/L (ref 39–117)
BUN: 14 mg/dL (ref 6–23)
Creat: 1.07 mg/dL (ref 0.50–1.35)

## 2013-05-28 MED ORDER — LISINOPRIL 10 MG PO TABS: 10.0000 mg | ORAL_TABLET | Freq: Every day | ORAL | Status: DC

## 2013-05-28 MED ORDER — METFORMIN HCL 1000 MG PO TABS: ORAL_TABLET | ORAL | Status: DC

## 2013-05-28 NOTE — Assessment & Plan Note (Addendum)
Type 2 DM continues to be well-controlled Current HgbA1c 6.6 (05/28/13), last HgbA1c 6.4 (06/11/12). Meds: Glipizide 5mg  BID No complications or hypoglycemia. HM: Eye Exam (reported 2014, last documented Dr. Hanley Seamen 2012), Foot Exam (performed 05/28/13)  Plan:  1. Restart Metformin 1000mg  BID. Hold Glipizide. 2. Continue Lisinopril 10mg  daily 3. Ordered CMET, Direct LDL 4. Lifestyle Mods - Acknowledges importance of improved DM diet (dec carbs, inc vegs) 5. Goal - inc exercise with indoor stationary bike back up to 3-4x weekly 6. Daily ASA 7. Request Ophtho records, Declined Pneumo vax, RTC 6 months  Future consideration: 1. Plan to discuss statin therapy at next visit

## 2013-05-28 NOTE — Patient Instructions (Signed)
Dear Calvin Newton, Thank you for coming in to clinic today. It was good to meet you!  Today we discussed your Diabetes and Abdominal Discomfort. 1. Your Diabetes appears to be well controlled. However, your A1c has increased from 6.4 to 6.6    - Please take your Metformin and Lisinopril daily. You no longer need to use the Glipizide unless your sugars remain elevated 2. Make sure you continue to increase you exercise, using the indoor bike sounds great at least 3-4 times daily. 3. Please be sure to work on eating healthy, decrease calorie intake, by eating smaller portions with each meal, and increase vegetables. 4. For your abdominal pain, at this point it seems most likely related to your abdominal wall, and not an organ inside your abdomen. Since you have already had a colonoscopy recently, I will send you for an abdominal ultrasound. After this is completed, I would recommend coming back to clinic to discuss results.  Some important numbers from today's visit: HgA1C - 6.6 BP - 127/86  Please schedule a follow-up appointment with me or any available doctor within 1 month, after your Ultrasound is completed.  Please schedule a 6 month follow-up appointment with me for your Diabetes.  If you have any other questions or concerns, please feel free to call the clinic to contact me. You may also schedule an earlier appointment if necessary.  However, if your symptoms get significantly worse, please go to the Emergency Department to seek immediate medical attention.  Saralyn Pilar, DO [facility]

## 2013-05-28 NOTE — Assessment & Plan Note (Addendum)
Unclear etiology of longstanding chronic complaint. Overall clinically non-concerning for acute intra-abdominal process. Suggestive of abdominal wall strain vs defect / herniation, most likely related to significant increased weight gain. No red flag abdominal symptoms. Noted prior GI work-up, recent reported colonoscopy. No documented abd imaging.  Plan: 1. Ordered Right Abdominal limited US to evaluate for any possible abdominal wall defect / herniation, and rule out intra-abdominal process 2. CMET to eval LFTs 3. Advised to continue Aleve if improving symptoms. 4. Document possible triggers and what relieves discomfort. 5. RTC following [place] to discuss

## 2013-05-28 NOTE — Progress Notes (Signed)
Subjective:     Patient ID: Calvin Newton, male   DOB: 1951-02-04, 62 y.o.   MRN: [id number]  HPI  CHRONIC DM, Type 2: Reports that he has run out of most of his medications (including Lisinopril, Metformin, and Glipizide), recently called in for refill on Glipizide because he felt that his sugars were increasing. Acknowledges he has decreased his exercise and has had poor eating habits recently, noted about 30 lb wt gain in 1 year.  Today HgbA1c 6.6 (last 6.4). CBGs: Avg 120s, AM 150s, PM 100s. Checks CBGs 2x daily Meds: Glipizide 5mg  BID with meals. (Previously Metformin 1000mg  BID). No insulin Reports poor compliance. Tolerating well w/o side-effects Currently on ACEi. (ran out x 3-4 months) Complications: none HM: Eye Exam (reported 2014, last record Dr. Hanley Seamen 2014), Foot Exam (05/28/13)  Lifestyle: Diet (unbalanced, increased portions) / Exercise (minimal currently) Denies hypoglycemia, polyuria, visual changes, numbness or tingling.  CHRONIC RIGHT ABDOMINAL PAIN / DISCOMFORT: Reported history of chronic (>10+ yrs) complaint of occasional throbbing / bloated feeling on Right side of abdomen. Duration of episodes variable, discomfort is not constant and sometimes goes away for days at a time. Unable to associate symptoms with activities or eating. No significant relief and unclear on treatments attempted. Notes improvement when applies direct pressure, but denies any bulging or mass. Prior history with work-up at GI clinic, suspected MSK etiology, no imaging done. Suspect may be worse with increased weight this year, however notes previously still present prior to wt gain years ago. Noted colonoscopy (negative) performed several years ago, no record seen in chart. Reports taking Metamucil, Probiotic. Regular BM 1-2x daily.  Social Hx: Never smoker. Noted EtOH use >4x weekly.  Review of Systems  See above HPI.  Admits urinary frequency, weight gain. Denies fever, chills, CP,  dyspnea, nausea / vomiting, constipation, diarrhea, numbness, tingling, weakness.     Objective:   Physical Exam  BP 127/86  Pulse 92  Temp(Src) 98.1 F (36.7 C) (Oral)  Ht 6' (1.829 m)  Wt 277 lb (125.646 kg)  BMI 37.56 kg/m2  General - obese, pleasant and talkative, NAD HEENT - MMM Neck - supple, non-tender, no carotid bruits Heart - RRR, no murmurs heard Lungs - CTAB. Normal work of breathing Abd - significant central adiposity, soft, NTND, no rebound or guarding, Carnett's test without change in abdominal discomfort, no mass palpated, no obvious abd wall herniation, +active BS. No aortic bruit heard, without pulsatile mass Genital - no inguinal hernia b/l Ext - Left knee scar noted, non-tender, no edema, +2 peripheral pulses Neuro - AAOx3, grossly non-focal     Assessment:     See specific A&P problem list for details.      Plan:     See specific A&P problem list for details.

## 2013-06-20 ENCOUNTER — Encounter: Payer: Self-pay | Admitting: Family Medicine

## 2013-06-25 ENCOUNTER — Telehealth: Payer: Self-pay | Admitting: Family Medicine

## 2013-06-25 NOTE — Telephone Encounter (Signed)
Pt called and wanted to know about her referral. He has an appointment on 1/20 and this appointment was to go over his radiology results and he has not been called for an appointment. jw

## 2013-06-27 ENCOUNTER — Ambulatory Visit: Payer: Managed Care, Other (non HMO) | Admitting: Family Medicine

## 2013-06-27 NOTE — Telephone Encounter (Signed)
Attempted to call patient at the number left but it is not working. 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 Also the [employer] is not valid as his insurance anymore and it is [employer], no card on file/scanned in, cannot make appointment. I understand that patient is wanting this done before his appointment on 1/20, but without proper information I cannot prior approve so that this can be don, cannot contact patient also.Erma PintoEvans, Karin Pinedo Kristin

## 2013-07-01 NOTE — Telephone Encounter (Signed)
Pt called about the reason his MRI wasn't scheduled. After reading him the previous note, both home and work phone numbers were changed. He was told to bring in his [facility] insurance, have it scanned into his file and we would then schedule the MRI

## 2013-07-02 ENCOUNTER — Ambulatory Visit: Payer: 59 | Admitting: Family Medicine

## 2013-07-02 NOTE — Telephone Encounter (Signed)
I don't process MRI referral [facility]  [place]

## 2013-12-03 ENCOUNTER — Other Ambulatory Visit: Payer: Self-pay | Admitting: Family Medicine

## 2013-12-12 NOTE — Telephone Encounter (Signed)
Pt called about refill on metaformin

## 2013-12-31 ENCOUNTER — Encounter: Payer: Self-pay | Admitting: Family Medicine

## 2013-12-31 ENCOUNTER — Ambulatory Visit (INDEPENDENT_AMBULATORY_CARE_PROVIDER_SITE_OTHER): Payer: 59 | Admitting: Family Medicine

## 2013-12-31 VITALS — BP 125/81 | HR 95 | Temp 98.2°F | Resp 18 | Wt 258.0 lb

## 2013-12-31 DIAGNOSIS — G8929 Other chronic pain: Secondary | ICD-10-CM

## 2013-12-31 DIAGNOSIS — E669 Obesity, unspecified: Secondary | ICD-10-CM

## 2013-12-31 DIAGNOSIS — R1031 Right lower quadrant pain: Secondary | ICD-10-CM

## 2013-12-31 DIAGNOSIS — E119 Type 2 diabetes mellitus without complications: Secondary | ICD-10-CM

## 2013-12-31 LAB — POCT GLYCOSYLATED HEMOGLOBIN (HGB A1C): HEMOGLOBIN A1C: 7.4

## 2013-12-31 MED ORDER — METFORMIN HCL 1000 MG PO TABS: 1000.0000 mg | ORAL_TABLET | Freq: Two times a day (BID) | ORAL | Status: DC

## 2013-12-31 MED ORDER — GLIPIZIDE 5 MG PO TABS: 5.0000 mg | ORAL_TABLET | Freq: Every day | ORAL | Status: DC

## 2013-12-31 NOTE — Progress Notes (Signed)
Patient ID: Calvin Newton, male   DOB: 1951/05/03, [name] y.o.   MRN: [id number]  Subjective:   HPI  CHRONIC DM, Type 2: Reports - out of metformin x 1 month (pharmacy would not refill?), CBGs: Avg 140, Low 98, High 180. Checks CBGs 1-2x daily  (did not bring readings in) Meds: Metformin 1000mg  BID, however recently off x 1 month, off Glipizide 5mg  daily (years) Reports good compliance. Tolerating well w/o side-effects Currently on ACEi Denies hypoglycemia, polyuria, visual changes, numbness or tingling.  OBESITY: - Weight loss 20 lb loss intentionally in 7 months - Eating health, inc salads, dec fast food - Active working >50 hours weekly, exercise push-ups and bicycle riding  CHRONIC ABDOMINAL PAIN: - Reported hx chronic >30 years R-mid abdominal pain, discomfort / bloating. Not worse with palpation, unable to determine triggers or what makes worse / better - States symptoms have improved following weight loss - Last OV 05/2013, concern for likely abdominal wall issue / unlikely hernia, ordered US but, stated he did not get US previously as it was unable to get scheduled, insurance issue - Previously worked-up by GI, negative colonoscopy - Takes metamucil - Currently has been relatively milk free diet, drinks coconut milk - Denies diarrhea, constipation, blood in stool, nausea, vomiting, fevers/chills  Social Hx: Never smoker  Review of Systems  See above HPI.     Objective:   Physical Exam  BP 125/81  Pulse 95  Temp(Src) 98.2 F (36.8 C) (Oral)  Resp 18  Wt 258 lb (117.028 kg)  SpO2 95%  General - obese, pleasant, NAD Heart - RRR, no murmurs heard Lungs - CTAB Abd - significant central adiposity, soft, NTND, no rebound or guarding, mild slightly firm area palpated over mid R abdomen (no change on sitting up / bearing down), Murphy's negative, McBurney's negative, Liver scratch test normal limits, spleen non-palpable, +active BS. No aortic bruit heard Ext - non-tender,  no edema, +2 peripheral pulses Neuro - awake, alert  DM Foot Exam - No ulcers / lesions, intact sensation b/l to monofilament     Assessment:     See specific A&P problem list for details.      Plan:     See specific A&P problem list for details.

## 2013-12-31 NOTE — Assessment & Plan Note (Signed)
Appropriate intentional 20 lb wt loss in 7 months  Plan: 1. Continue improved diet / exercise

## 2013-12-31 NOTE — Patient Instructions (Addendum)
Dear Calvin Newton, Thank you for coming in to clinic today. It was good to see you!  Today we discussed your Diabetes and Abdominal Pain. 1. For your Diabetes, unfortunately your 3 month sugar has increased to 7.4. This can be caused by a number of things. Most important I believe we can improve this together. 2. Sent refill for Metformin take 1000mg  tablet twice daily with meals 3. Sent prescription for Glipizide 5mg  - take 1 tab daily with breakfast. We may need to increase this to twice daily in the future. 4. For your abdominal pain, I will order the Abdominal Ultrasound to check for a possible hernia. You will be called with this appointment. Please call our clinic and leave a message for me if you do not hear back within the next 1 month.  Some important numbers from today's visit: HgA1C - 7.4  Please schedule a follow-up appointment with me (Dr. Althea Charon) in 3 months for Diabetes follow-up.  If you have any other questions or concerns, please feel free to call the clinic to contact me. You may also schedule an earlier appointment if necessary.  However, if your symptoms get significantly worse, please go to the Emergency Department to seek immediate medical attention.  Calvin Pilar, DO [facility]   Irritable Bowel Syndrome Irritable bowel syndrome (IBS) is caused by a disturbance of normal bowel function and is a common digestive disorder. You may also hear this condition called spastic colon, mucous colitis, and irritable colon. There is no cure for IBS. However, symptoms often gradually improve or disappear with a good diet, stress management, and medicine. This condition usually appears in late adolescence or early adulthood. Women develop it twice as often as men. CAUSES  After food has been digested and absorbed in the small intestine, waste material is moved into the large intestine, or colon. In the colon, water and salts are absorbed from the  undigested products coming from the small intestine. The remaining residue, or fecal material, is held for elimination. Under normal circumstances, gentle, rhythmic contractions of the bowel walls push the fecal material along the colon toward the rectum. In IBS, however, these contractions are irregular and poorly coordinated. The fecal material is either retained too long, resulting in constipation, or expelled too soon, producing diarrhea. SIGNS AND SYMPTOMS  The most common symptom of IBS is abdominal pain. It is often in the lower left side of the abdomen, but it may occur anywhere in the abdomen. The pain comes from spasms of the bowel muscles happening too much and from the buildup of gas and fecal material in the colon. This pain:  Can range from sharp abdominal cramps to a dull, continuous ache.  Often worsens soon after eating.  Is often relieved by having a bowel movement or passing gas. Abdominal pain is usually accompanied by constipation, but it may also produce diarrhea. The diarrhea often occurs right after a meal or upon waking up in the morning. The stools are often soft, watery, and flecked with mucus. Other symptoms of IBS include:  Bloating.  Loss of appetite.  Heartburn.  Backache.  Dull pain in the arms or shoulders.  Nausea.  Burping.  Vomiting.  Gas. IBS may also cause symptoms that are unrelated to the digestive system, such as:  Fatigue.  Headaches.  Anxiety.  Shortness of breath.  Trouble concentrating.  Dizziness. These symptoms tend to come and go. DIAGNOSIS  The symptoms of IBS may seem like symptoms of other,  more serious digestive disorders. Your health care provider may want to perform tests to exclude these disorders.  TREATMENT Many medicines are available to help correct bowel function or relieve bowel spasms and abdominal pain. Among the medicines available are:  Laxatives for severe constipation and to help restore normal bowel  habits.  Specific antidiarrheal medicines to treat severe or lasting diarrhea.  Antispasmodic agents to relieve intestinal cramps. Your health care provider may also decide to treat you with a mild tranquilizer or sedative during unusually stressful periods in your life. Your health care provider may also prescribe antidepressant medicine. The use of this medicine has been shown to reduce pain and other symptoms of IBS. Remember that if any medicine is prescribed for you, you should take it exactly as directed. Make sure your health care provider knows how well it worked for you. HOME CARE INSTRUCTIONS   Take all medicines as directed by your health care provider.  Avoid foods that are high in fat or oils, such as heavy cream, butter, frankfurters, sausage, and other fatty meats.  Avoid foods that make you go to the bathroom, such as fruit, fruit juice, and dairy products.  Cut out carbonated drinks, chewing gum, and "gassy" foods such as beans and cabbage. This may help relieve bloating and burping.  Eat foods with bran, and drink plenty of liquids with the bran foods. This helps relieve constipation.  Keep track of what foods seem to bring on your symptoms.  Avoid emotionally charged situations or circumstances that produce anxiety.  Start or continue exercising.  Get plenty of rest and sleep. Document Released: 05/30/2005 Document Revised: 06/04/2013 Document Reviewed: 01/18/2008 [facility] Patient Information 2015 [place], [place]. This information is not intended to replace advice given to you by your health care provider. Make sure you discuss any questions you have with your health care provider.  Diet and Irritable Bowel Syndrome  No cure has been found for irritable bowel syndrome (IBS). Many options are available to treat the symptoms. Your caregiver will give you the best treatments available for your symptoms. He or she will also encourage you to manage stress and to make  changes to your diet. You need to work with your caregiver and Registered Dietician to find the best combination of medicine, diet, counseling, and support to control your symptoms. The following are some diet suggestions. FOODS THAT MAKE IBS WORSE  Fatty foods, such as [place] fries.  Milk products, such as cheese or ice cream.  Chocolate.  Alcohol.  Caffeine (found in coffee and some sodas).  Carbonated drinks, such as soda. If certain foods cause symptoms, you should eat less of them or stop eating them. FOOD JOURNAL   Keep a journal of the foods that seem to cause distress. Write down:  What you are eating during the day and when.  What problems you are having after eating.  When the symptoms occur in relation to your meals.  What foods always make you feel badly.  Take your notes with you to your caregiver to see if you should stop eating certain foods. FOODS THAT MAKE IBS BETTER Fiber reduces IBS symptoms, especially constipation, because it makes stools soft, bulky, and easier to pass. Fiber is found in bran, bread, cereal, beans, fruit, and vegetables. Examples of foods with fiber include:  Apples.  Peaches.  Pears.  Berries.  Figs.  Broccoli, raw.  Cabbage.  Carrots.  Raw peas.  Kidney beans.  Lima beans.  Whole-grain bread.  Whole-grain cereal.  Add foods with fiber to your diet a little at a time. This will let your body get used to them. Too much fiber at once might cause gas and swelling of your abdomen. This can trigger symptoms in a person with IBS. Caregivers usually recommend a diet with enough fiber to produce soft, painless bowel movements. High fiber diets may cause gas and bloating. However, these symptoms often go away within a few weeks, as your body adjusts. In many cases, dietary fiber may lessen IBS symptoms, particularly constipation. However, it may not help pain or diarrhea. High fiber diets keep the colon mildly enlarged (distended)  with the added fiber. This may help prevent spasms in the colon. Some forms of fiber also keep water in the stool, thereby preventing hard stools that are difficult to pass.  Besides telling you to eat more foods with fiber, your caregiver may also tell you to get more fiber by taking a fiber pill or drinking water mixed with a special high fiber powder. An example of this is a natural fiber laxative containing psyllium seed.  TIPS  Large meals can cause cramping and diarrhea in people with IBS. If this happens to you, try eating 4 or 5 small meals a day, or try eating less at each of your usual 3 meals. It may also help if your meals are low in fat and high in carbohydrates. Examples of carbohydrates are pasta, rice, whole-grain breads and cereals, fruits, and vegetables.  If dairy products cause your symptoms to flare up, you can try eating less of those foods. You might be able to handle yogurt better than other dairy products, because it contains bacteria that helps with digestion. Dairy products are an important source of calcium and other nutrients. If you need to avoid dairy products, be sure to talk with a Registered Dietitian about getting these nutrients through other food sources.  Drink enough water and fluids to keep your urine clear or pale yellow. This is important, especially if you have diarrhea. FOR MORE INFORMATION  International Foundation for Functional Gastrointestinal Disorders: www.iffgd.org  National Digestive Diseases Information Clearinghouse: digestive.StageSync.si Document Released: 08/20/2003 Document Revised: 08/22/2011 Document Reviewed: 05/07/2007 [facility] Patient Information 2015 [place], [place]. This information is not intended to replace advice given to you by your health care provider. Make sure you discuss any questions you have with your health care provider.   Gluten-Free Diet for Celiac Disease Gluten is a protein found in wheat, rye, barley, and triticale  (a cross between wheat and rye) grains. People with celiac disease need to have a gluten-free diet. With celiac disease, gluten interferes with the absorption of food and may also cause intestinal injury.  Strict compliance is important even during symptom-free periods. This means eliminating all foods with gluten from your diet permanently. This requires some significant changes but is very manageable. WHAT DO I NEED TO KNOW ABOUT A GLUTEN-FREE DIET?  Look for items labeled with "GF." Looking for GF will make it easier to identify products that are safe to eat.  Read all labels. Gluten may have been added as a minor ingredient where least expected, such as in shredded cheeses or ice creams. Always check food labels and investigate questionable ingredients. Talk to your dietitian or health care provider if you have questions about certain foods or need help finding GF foods.  Check when in doubt. If you are not sure whether an ingredient contains gluten, check with the manufacturer. Note that some manufacturers may change  ingredients without notice. Always read labels.   Know how food is prepared. Since flour and cereal products are often used in the preparation of foods, it is important to be aware of the methods of preparation used, as well as the ingredients in the foods themselves. This is especially true when you are dining out. Ask restaurants if they have a gluten-free menu.  Watch for cross-contamination. Cross-contamination occurs when gluten-free foods come into contact with foods that contain gluten. It often happens during the manufacturing process. Always check the ingredient list and for warnings on packages, such as "may contain gluten."  Eat a balanced diet. It is important to still get enough fiber, iron, and B vitamins in your diet. Look for enriched whole grain gluten-free products and continue to eat a well-balanced diet of the important non-grain items, such as vegetables, fruit,  lean proteins, legumes, and dairy.  Consider taking a gluten-free multivitamin and mineral supplement. Discuss this with your health care provider. WHAT KEY WORDS HELP IDENTIFY GLUTEN? Know key words to help identify gluten. A dietitian can help you identify possible harmful ingredients in the foods you normally eat. Words to check for on food labels include:   Flour, enriched flour, bromated flour, white flour, durum flour, graham flour, phosphated flour, self-rising flour, semolina, or farina.  Starch, dextrin, modified food starch, or cereal.  Thickening, fillers, or emulsifiers.  Any kind of malt flavoring, extract, or syrup (malt is made from barley and includes malt vinegar, malted milk, and malted beverages).  Hydrolyzed vegetable protein. WHAT FOODS CAN I EAT? Below is a list of common foods that are allowed with a gluten-free diet.  Grains Products made from the following flours or grains:amaranth,bean flours, 100% buckwheat flour, corn, millet, nut flours or meals, GF oats, quinoa, rice, sorghum, tef, any all-purpose 100% GF flour mix, rice wafers, pure cornmeal tortillas, popcorn, some crackers, some chips, and hot cereals made from cornmeal. Ask your dietitian which specific hot and cold cereals are allowed. Hominy, rice or wild rice, and special GF pasta. Some Asian rice noodles or bean noodles. Arrowroot starch, corn bran, corn flour, corn germ, cornmeal, corn starch, potato flour, potato starch flour, and rice bran. Rice flours: plain, brown, and sweet. Rice polish, soy flour, tapioca starch. Vegetables All plain, fresh, frozen, or canned vegetables.  Fruits All fresh, frozen, canned, dried fruits, and fruit juices.  Meats and Other Protein Foods Meat, fish, poultry, or eggs prepared without added wheat, rye, barley, or triticale. Some luncheon meat and some frankfurters. Pure meat. All aged cheese, most processed cheese products, some cottage cheese, and some cream  cheese. Dried beans, dried peas, and lentils.  Dairy Milk and yogurt made with allowed ingredients.  Beverages Coffee (regular or decaffeinated), tea, herbal tea (read label to be sure that no wheat flour has been added). Carbonated beverages and some root beers. Wine, sake, and distilled spirits, such as gin, vodka, and whiskey. GF beers and GF ciders.  Sweetsand Desserts Sugar, honey, some syrups, molasses, jelly, jam, plain hard candy, marshmallows, gumdrops, homemade candies free of wheat, rye, barley, or triticale. Coconut. Custard, some pudding mixes, and homemade puddings from cornstarch, rice, and tapioca. Gelatin desserts, sorbets, frozen ice pops, and sherbet. Cake, cookies, and other desserts prepared with allowed flours. Some commercial ice creams. Ask your dietitian about specific brands of dessert that are allowed.  Fats and Oils Butter, margarine, vegetable oil, sour cream not containing modified food starch, whipping cream, shortening, lard, cream, and some mayonnaise.  Some commercial salad dressings. Peanut butter.  Other Homemade broth and soups made with allowed ingredients; some canned or frozen soups. Any other combination or prepared foods that do not contain gluten. Monosodium glutamate (MSG). Cider, rice, and wine vinegar. Baking soda and baking powder. Certain soy sauces (Tamari). Ask your dietitian about specific brands that are allowed. Nuts, coconut, chocolate, and pure cocoa powder. Salt, pepper, herbs, spices, extracts, and food colorings. The items listed above may not be a complete list of allowed foods or beverages. Contact your dietitian for more options.  WHAT FOODS CAN I NOT EAT? Below is a list of common foods that are not allowed with a gluten-free diet.  Grains Barley, bran, bulgur, cracked wheat, graham, malt, matzo, wheat germ, and all wheat and rye cereals including spelt and kamut. Avoid cereals containing malt as a flavoring, such as rice cereal. Also  avoid regular noodles, spaghetti, macaroni, and most packaged rice mixes, and all others containing wheat, rye, barley, or triticale.  Vegetables Most creamed vegetables, most vegetables canned in sauces, and any vegetables prepared with wheat, rye, barley, or triticale.  Fruits Thickened or prepared fruits and some pie fillings.  Meats and Other Protein Sources Any meat or meat alternative containing wheat, rye, barley, or gluten stabilizers (such as some hot dogs, salami, cold cuts, or sausage). Bread-containing products, such as Swiss steak, croquettes, and meatloaf. Most tuna canned in vegetable broth, [place] with hydrolyzed vegetable protein (HVP) injected as part of the basting, and any cheese product containing oat gum as an ingredient. Seitan. Imitation fish. Dairy Commercial chocolate milk, which may have cereal added, and malted milk. Beverages Certain cereal beverages. Beer and ciders (unless GF), ale, malted milk, and some root beers. Sweetsand Desserts Commercial candies containing wheat, rye, barley, or triticale. Certain toffees are dusted with wheat flour. Chocolate-coated nuts, which are often rolled in flour. Cakes, cookies, doughnuts, and pastries that are prepared with wheat, barley, rye, or triticale flour. Some commercial ice creams, ice cream flavors which contain cookies, crumbs, or cheesecake. Ice cream cones. Commercially prepared mixes for cakes, cookies, and other desserts unless marked GF. Bread pudding and other puddings thickened with flour. Fats and Oils Some commercial salad dressings and sour cream containing modified food starch.  Condiments Some curry powder, some dry seasoning mixes, some gravy extracts, some meat sauces, some ketchup, some prepared mustard, horseradish. Other All soups containing wheat, rye, barley, or triticale flour. Bouillon and bouillon cubes that contain HVP. Combination or prepared foods that contain gluten. Some soy sauce, some  chip dips, and some chewing gum. Yeast extract (contains barley). Caramel color (may contain malt). The items listed above may not be a complete list of foods and beverages to avoid. Contact your dietitian for more information. Document Released: 05/30/2005 Document Revised: 06/04/2013 Document Reviewed: 04/03/2013 [facility] Patient Information 2015 [place], [place]. This information is not intended to replace advice given to you by your health care provider. Make sure you discuss any questions you have with your health care provider.

## 2013-12-31 NOTE — Assessment & Plan Note (Addendum)
Recent worsening control Current HgbA1c 7.2 (12/31/13), last HgbA1c 6.6 (05/28/13). No complications or hypoglycemia. - Followed by [facility]  Plan:  1. Refilled Metformin 1000mg  BID WC - (unclear why unable to pick-up from pharmacy, advised [facility] leave message next time if about to run out) 2. Start Glipizide 5mg  PO daily with breakfast, consider inc to BID in future if needed 3. Continue improved lifestyle mods - Improved DM diet (dec carbs, inc vegs), exercise (inc frequency) 4. RTC 3 months, repeat A1c, consider statin

## 2013-12-31 NOTE — Assessment & Plan Note (Signed)
Persistent, improved following wt loss. Inc suspicion of IBS (with localized bloating/discomfort symptoms). Previously considered abdominal wall / hernia?, less likely - Chronic >30 yrs - Hx negative colonoscopy - No red flag symptoms - LFTs previously negative  Plan: 1. Monitor. Continue trial lactose free diet 2. Consider trial Gluten free diet x 1 month 3. Hold off on re-ordering Abd US, unlikely hernia 4. Continue Metamucil 5. RTC PRN

## 2014-06-23 ENCOUNTER — Other Ambulatory Visit: Payer: Self-pay | Admitting: Family Medicine

## 2014-06-23 DIAGNOSIS — E119 Type 2 diabetes mellitus without complications: Secondary | ICD-10-CM

## 2014-06-30 ENCOUNTER — Telehealth: Payer: Self-pay | Admitting: Family Medicine

## 2014-07-28 ENCOUNTER — Encounter: Payer: Self-pay | Admitting: Family Medicine

## 2014-08-18 ENCOUNTER — Ambulatory Visit (INDEPENDENT_AMBULATORY_CARE_PROVIDER_SITE_OTHER): Payer: 59 | Admitting: Family Medicine

## 2014-08-18 ENCOUNTER — Encounter: Payer: Self-pay | Admitting: Family Medicine

## 2014-08-18 VITALS — BP 122/71 | HR 99 | Ht 72.0 in | Wt 264.0 lb

## 2014-08-18 DIAGNOSIS — M9902 Segmental and somatic dysfunction of thoracic region: Secondary | ICD-10-CM

## 2014-08-18 DIAGNOSIS — G8929 Other chronic pain: Secondary | ICD-10-CM

## 2014-08-18 DIAGNOSIS — M62838 Other muscle spasm: Secondary | ICD-10-CM

## 2014-08-18 DIAGNOSIS — E119 Type 2 diabetes mellitus without complications: Secondary | ICD-10-CM

## 2014-08-18 DIAGNOSIS — R1031 Right lower quadrant pain: Secondary | ICD-10-CM

## 2014-08-18 LAB — BASIC METABOLIC PANEL
BUN: 20 mg/dL (ref 6–23)
CALCIUM: 9.9 mg/dL (ref 8.4–10.5)
CO2: 27 meq/L (ref 19–32)
Chloride: 101 mEq/L (ref 96–112)
Creat: 1.17 mg/dL (ref 0.50–1.35)
GLUCOSE: 101 mg/dL — AB (ref 70–99)
Potassium: 4.3 mEq/L (ref 3.5–5.3)
Sodium: 135 mEq/L (ref 135–145)

## 2014-08-18 LAB — POCT GLYCOSYLATED HEMOGLOBIN (HGB A1C): Hemoglobin A1C: 6.3

## 2014-08-18 LAB — LDL CHOLESTEROL, DIRECT: LDL DIRECT: 105 mg/dL — AB

## 2014-08-18 MED ORDER — CYCLOBENZAPRINE HCL 5 MG PO TABS: 5.0000 mg | ORAL_TABLET | Freq: Every evening | ORAL | Status: DC | PRN

## 2014-08-18 MED ORDER — LISINOPRIL 10 MG PO TABS
10.0000 mg | ORAL_TABLET | Freq: Every day | ORAL | Status: DC
Start: 2014-08-18 — End: 2015-11-16

## 2014-08-18 NOTE — Progress Notes (Signed)
   Subjective:    Patient ID: Calvin Newton, male    DOB: [birth year], 64 y.o.   MRN: [id number]  Patient presents for annual exam.  HPI  CHRONIC DM, Type 2: Reports no concerns - last DM f/u 12/2013 CBGs: Avg 115, Low 99, High 167. Checks CBGs 2x (fasting AM avg 130s and evening 100s) Meds: Metformin 1000mg  BID and Glipizide 5mg  daily Reports good compliance. Tolerating well w/o side-effects Currently on ACEi Lifestyle: Diet (gluten free, inc fiber, inc salads, reduce carbs) / Exercise (restarted recently with weight training and cardio with stationary bicycle) Denies hypoglycemia, polyuria, visual changes, numbness or tingling.  CHRONIC LOWER ABD PAIN: - Last evaluated 12/2013, thought to be chronic functional problem, possibly IBS - No significant change today. Overall seems improved with gluten free diet and inc fiber (taking Metamucil). Still present on most days, vague description seems like "bloating" or feels like a "bubble" with some discomfort. - Prior history GI work-up >30 years ago and had colonoscopy (reportedly negative >10 years ago) - Denies any new abdominal pain, constipation / diarrhea, blood in stool or rectal bleeding, nausea, vomiting, fevers/chills  UPPER BACK / SHOULDER PAIN, LEFT: - Recent history of gradual worsening Left upper back / shoulder blade pain associated with "muscle spasms". 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 States pain worse with keeping arm in fixed position, improved if raises arm and with heat against sore spot on back. Some relief with ibuprofen. - Denies numbness, weakness, or tingling in either extremity  HM: - Last colonoscopy >10 years ago (no record within Epic chart), performed by Dr. Elnoria HowardHung  I have reviewed and updated the following as appropriate: allergies and current  medications  Social Hx: Never smoker  Review of Systems  See above HPI    Objective:   Physical Exam  BP 142/82 mmHg  Pulse 99  Ht 6' (1.829 m)  Wt 264 lb (119.75 kg)  BMI 35.80 kg/m2  General - obese with large muscular build, well-appearing, NAD Neck - supple, non-tender, full active ROM Heart - RRR, no murmurs heard Abd - significant central adiposity, soft, NTND, no rebound or guarding, +active BS. No aortic bruit MSK - Upper Back: localized +TTP over superior-medial musculature of Left scapula (rhomboids and levator scap) with palpable paraspinal hypertonicity and spasm, full active ROM Left shoulder including arm above head in flexion and internal rotation, negative impingement testing, rotator cuff str intact 5/5 supraspinatus Ext - non-tender, no edema, +2 peripheral pulses Neuro - awake, alert, distal sensation to light touch intact b/l upper ext     Assessment & Plan:   See specific A&P problem list for details.

## 2014-08-18 NOTE — Patient Instructions (Addendum)
Dear Jake SharkHarold, Thank you for coming in to clinic today. It was good to see you!  1. Diabetes is improved - Good job - keep up the good work. 2. For your Shoulder - this is most likely a muscle spasm from overuse (keeping it lifted up while driving)  - take Flexeril 5mg  at bedtime for muscle relaxant (may use 2 tablets if not helping)  - Continue Tylenol / Ibuprofen as needed  - Heating pad, and massage as demonstrated 3. For your Abdominal Pain - continue current diet  - please call Dr. Elnoria HowardHung Dr. Elnoria HowardHung Office - call to schedule Colonoscopy [address], [place], [place] [address]  [phone]  Some important numbers from today's visit: HgA1C - 6.3 (from 7.4) BP - 124/79 Results -  Please schedule a follow-up appointment with 6 months for Diabetes  If you have any other questions or concerns, please feel free to call the clinic to contact me. You may also schedule an earlier appointment if necessary.  However, if your symptoms get significantly worse, please go to the Emergency Department to seek immediate medical attention.  Saralyn PilarAlexander Karamalegos, DO [facility]

## 2014-08-19 ENCOUNTER — Encounter: Payer: Self-pay | Admitting: Family Medicine

## 2014-08-19 DIAGNOSIS — M9902 Segmental and somatic dysfunction of thoracic region: Secondary | ICD-10-CM | POA: Insufficient documentation

## 2014-08-19 NOTE — Assessment & Plan Note (Signed)
Chronic persistent, stable to improved since last visit. Remains unclear etiology, seems functional related to dietary changes. Exam unremarkable.  Plan: 1. Advised to contact Dr. Elnoria HowardHung to see when due for next colonoscopy (reportedly > 10 years ago), also can follow-up with GI to see if has any further input (will go ahead and place referral) 2. Continue current gluten free dietary changes (seemed to improve) 3. Cont Metamucil daily fiber supplement 4. Consider future Abd US if colonoscopy negative and interested in further work-up

## 2014-08-19 NOTE — Assessment & Plan Note (Signed)
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 Improved with heat and ibuprofen.  Plan: 1. Rx Flexeril 5mg  qhs, can increase to 10mg  qhs PRN pain / spasm. 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 2. Demonstrated scapular release myofascial OMT technique for Left Scapula musculature, patient provided verbal consent, treated, and had mild improvement after treatment. 3. Moist heat / heating pad, massage at home, Tylenol / Ibuprofen PRN 4. Recommend adjusting arm position while driving 5. RTC PRN

## 2014-08-19 NOTE — Assessment & Plan Note (Addendum)
Improved control Current HgbA1c 6.3 (08/18/14), last HgbA1c 7.4 (12/31/13). No complications or hypoglycemia.  Plan:  1. Continue current therapy - Metformin 1000mg  BID and Glipizide 5mg  PO daily - pt with goal to eventually reduce medications 2. Lifestyle Mods - continue improved glut free / DM diet (dec carbs, inc vegs), cont regular exercise regimen (wt training and cardio) 3. Check direct LDL today 4. Check BMET - Cr 1.17 stable 5. ASCVD 10 yr Risk (using last lipid profile 2012) >14% - recommend starting statin in setting of DM2 (mod - high intensity). Discussed with patient, to consider and reval at next apt 6. RTC 6 months for next A1c / DM eval

## 2014-08-20 ENCOUNTER — Encounter: Payer: Self-pay | Admitting: Family Medicine

## 2014-09-08 ENCOUNTER — Other Ambulatory Visit: Payer: Self-pay | Admitting: Psychiatry

## 2014-09-08 DIAGNOSIS — R1031 Right lower quadrant pain: Secondary | ICD-10-CM

## 2014-09-16 ENCOUNTER — Encounter: Payer: Self-pay | Admitting: Family Medicine

## 2014-09-16 DIAGNOSIS — K2970 Gastritis, unspecified, without bleeding: Secondary | ICD-10-CM | POA: Insufficient documentation

## 2014-09-18 ENCOUNTER — Other Ambulatory Visit: Payer: Self-pay | Admitting: Family Medicine

## 2014-09-18 DIAGNOSIS — E119 Type 2 diabetes mellitus without complications: Secondary | ICD-10-CM

## 2014-09-22 ENCOUNTER — Ambulatory Visit (HOSPITAL_COMMUNITY): Payer: 59

## 2014-09-23 ENCOUNTER — Ambulatory Visit (HOSPITAL_COMMUNITY): Payer: 59

## 2014-09-30 ENCOUNTER — Ambulatory Visit (HOSPITAL_COMMUNITY)
Admission: RE | Admit: 2014-09-30 | Discharge: 2014-09-30 | Disposition: A | Discharge status: Home / Self Care | Payer: 59 | Source: Ambulatory Visit | Attending: Gastroenterology | Admitting: Gastroenterology

## 2014-09-30 DIAGNOSIS — R1031 Right lower quadrant pain: Secondary | ICD-10-CM | POA: Insufficient documentation

## 2014-09-30 DIAGNOSIS — R911 Solitary pulmonary nodule: Secondary | ICD-10-CM | POA: Diagnosis not present

## 2014-09-30 DIAGNOSIS — I700 Atherosclerosis of aorta: Secondary | ICD-10-CM | POA: Diagnosis not present

## 2014-09-30 MED ORDER — IOHEXOL 300 MG/ML  SOLN
100.0000 mL | Freq: Once | INTRAMUSCULAR | Status: AC | PRN
  Administered 2014-09-30: 100 mL via INTRAVENOUS

## 2014-10-20 ENCOUNTER — Encounter: Payer: Self-pay | Admitting: Family Medicine

## 2015-01-07 ENCOUNTER — Other Ambulatory Visit: Payer: Self-pay | Admitting: Family Medicine

## 2015-01-07 DIAGNOSIS — E119 Type 2 diabetes mellitus without complications: Secondary | ICD-10-CM

## 2015-08-09 ENCOUNTER — Other Ambulatory Visit: Payer: Self-pay | Admitting: Family Medicine

## 2015-08-09 DIAGNOSIS — E119 Type 2 diabetes mellitus without complications: Secondary | ICD-10-CM

## 2015-08-10 ENCOUNTER — Telehealth: Payer: Self-pay | Admitting: *Deleted

## 2015-08-10 DIAGNOSIS — E119 Type 2 diabetes mellitus without complications: Secondary | ICD-10-CM

## 2015-08-10 MED ORDER — GLIPIZIDE 5 MG PO TABS: 5.0000 mg | ORAL_TABLET | Freq: Every day | ORAL | Status: DC

## 2015-08-10 NOTE — Telephone Encounter (Signed)
Refilled Glipizide for #90 pills +1 refill for 90 day supply as requested.  Saralyn Pilar, DO [facility], PGY-3

## 2015-08-10 NOTE — Telephone Encounter (Signed)
Pharmacist from [facility] stating they need a 90 days supply per insurance coverage for glipiZIDE (GLUCOTROL) 5 MG tablet . Clovis Pu, RN

## 2015-08-24 ENCOUNTER — Telehealth: Payer: Self-pay | Admitting: Family Medicine

## 2015-08-24 NOTE — Telephone Encounter (Signed)
A copy of A1c from 08/18/15 faxed to [phone] for patient's job. ROI was completed.

## 2015-08-31 ENCOUNTER — Encounter: Payer: Self-pay | Admitting: Family Medicine

## 2015-08-31 ENCOUNTER — Ambulatory Visit (INDEPENDENT_AMBULATORY_CARE_PROVIDER_SITE_OTHER): Payer: Commercial Managed Care - HMO | Admitting: Family Medicine

## 2015-08-31 VITALS — BP 117/66 | HR 76 | Temp 97.8°F | Ht 72.0 in | Wt 276.6 lb

## 2015-08-31 DIAGNOSIS — Z0000 Encounter for general adult medical examination without abnormal findings: Secondary | ICD-10-CM | POA: Diagnosis not present

## 2015-08-31 DIAGNOSIS — E119 Type 2 diabetes mellitus without complications: Secondary | ICD-10-CM

## 2015-08-31 DIAGNOSIS — E1169 Type 2 diabetes mellitus with other specified complication: Secondary | ICD-10-CM

## 2015-08-31 DIAGNOSIS — E785 Hyperlipidemia, unspecified: Secondary | ICD-10-CM

## 2015-08-31 DIAGNOSIS — Z125 Encounter for screening for malignant neoplasm of prostate: Secondary | ICD-10-CM | POA: Diagnosis not present

## 2015-08-31 DIAGNOSIS — Z114 Encounter for screening for human immunodeficiency virus [HIV]: Secondary | ICD-10-CM | POA: Diagnosis not present

## 2015-08-31 DIAGNOSIS — Z23 Encounter for immunization: Secondary | ICD-10-CM

## 2015-08-31 LAB — POCT GLYCOSYLATED HEMOGLOBIN (HGB A1C): Hemoglobin A1C: 6.7

## 2015-08-31 NOTE — Assessment & Plan Note (Signed)
+  12 lb wt gain in 1 year - Counseled on diet / exercise modifications, goal to inc regular exercise

## 2015-08-31 NOTE — Assessment & Plan Note (Signed)
Not in statin therapy Check fasting lipid panel (future order) RTC 2-4 weeks after lab review results, again recommend statin therapy in DM2

## 2015-08-31 NOTE — Progress Notes (Signed)
Subjective:    Patient ID: Calvin Newton, male    DOB: Oct 30, 1950, 65 y.o.   MRN: [id number]  Calvin LionsHarold C Kreh is a 65 y.o. male presenting on 08/31/2015 for Diabetes and Annual Physical  HPI  CHRONIC DM, Type 2: Reports no concerns today. CBGs: Avg 115, Low 99, High 167. Seems to be highest in AM. Checks CBGs 2x (fasting AM avg 130s and evening 100s) Meds: Metformin 1000mg  BID and Glipizide 5mg  daily Reports good compliance. Tolerating well w/o side-effects Currently on ACEi - Needs ophthalmology apt, saw optometry >1 year ago - Takes ASA 81. Never on statin Denies hypoglycemia, polyuria, visual changes, numbness or tingling.  MORBID OBESITY / HYPERLIPIDEMIA - Weight gain +12 lbs in 1 year Lifestyle: Diet (gluten free, inc fiber, inc salads, reduce carbs) / Exercise (has reduced his exercise regimen recently, only does 3 days a week 1-2x a month, no longer regular weight training and cardio  HM: - Due for colonoscopy, followed by Dr Elnoria HowardHung (patient unsure date of last colonoscopy) - Due for HIV screening - Due for influenza (declined) and pneumonia vaccine (agreed) - Request prostate cancer screening. Denies family or personal history of prostate CA. Has not had DRE before. History of prior PSA but does not recall level. None in chart. Never seen Urology or had biopsy.  Social History  Substance Use Topics  . Smoking status: Never Smoker   . Smokeless tobacco: Never Used  . Alcohol Use: No    Review of Systems Per HPI unless specifically indicated above     Objective:    BP 117/66 mmHg  Pulse 76  Temp(Src) 97.8 F (36.6 C) (Oral)  Ht 6' (1.829 m)  Wt 276 lb 9.6 oz (125.465 kg)  BMI 37.51 kg/m2  Wt Readings from Last 3 Encounters:  08/31/15 276 lb 9.6 oz (125.465 kg)  08/18/14 264 lb (119.75 kg)  12/31/13 258 lb (117.028 kg)    Physical Exam  Constitutional: He appears well-developed and well-nourished. No distress.  Obese, large body habitus with muscular  build, well-appearing, NAD  HENT:  Head: Normocephalic and atraumatic.  Mouth/Throat: Oropharynx is clear and moist.  Eyes: Conjunctivae are normal.  Neck: Normal range of motion. Neck supple. No thyromegaly present.  Cardiovascular: Normal rate, regular rhythm, normal heart sounds and intact distal pulses.   No murmur heard. Pulmonary/Chest: Effort normal and breath sounds normal. No respiratory distress. He has no wheezes. He has no rales.  Abdominal: Soft. Bowel sounds are normal. He exhibits no distension and no mass. There is tenderness (mild right sided discomfort without discrete tenderness). There is no rebound and no guarding.  Obese  Genitourinary:  DRE - normal appearing external anus w/o hemorrhoids, mild-mod enlarged smooth prostate seems uniform without asymmetry, soft without firmness or nodularity, non-tender. No rectal bleeding.  Musculoskeletal: Normal range of motion. He exhibits no edema or tenderness.  Lymphadenopathy:    He has no cervical adenopathy.  Neurological: He is alert.  Skin: Skin is warm and dry. He is not diaphoretic.  Nursing note and vitals reviewed.   DM Foot Exam - bilateral feet intact sensation bilateral to monofilament testing, noted deformity with onychomycosis bilaterally and dry thickened skin without ulceration.   Results for orders placed or performed in visit on 08/31/15  POCT A1C  Result Value Ref Range   Hemoglobin A1C 6.7       Assessment & Plan:   Problem List Items Addressed This Visit    Diabetes type 2, controlled (  HCC)    Well controlled. A1c 6.3 to 6.7. Never on insulin No complications or hypoglycemia.  Plan:  1. Continue - Metformin  BID and Glipizide  PO daily 2. Lifestyle Mods - continue improved glut free / DM diet (dec carbs, inc vegs), need to resume regular exercise regimen 2. Ordered future CMET, lipids 3. On ASA 81, never on statin. Prior ASCVD >10% using old lipid panel results, again recommended  statin, however defer until follow-up lab result visit to discuss 4. Referral to Ophtho DM eye exam 5. RTC 6 months for A1c, follow-up 4 weeks for lab results statin      Relevant Orders   POCT A1C (Completed)   COMPLETE METABOLIC PANEL WITH GFR   Ambulatory referral to Ophthalmology   Hyperlipidemia associated with type 2 diabetes mellitus (HCC)    Not in statin therapy Check fasting lipid panel (future order) RTC 2-4 weeks after lab review results, again recommend statin therapy in DM2      Relevant Orders   Lipid panel   Morbid obesity (HCC)    +12 lb wt gain in 1 year - Counseled on diet / exercise modifications, goal to inc regular exercise      Relevant Orders   Lipid panel   Prostate cancer screening    No concerning history of exam findings. Routine screening in high risk population AA male age >25 - DRE 08/31/15: enlarged smooth prostate  Plan: 1. Check PSA total and free today, counseled on benefits/risks of screening and potential further work-up with Urology if persistent elevated.      Relevant Orders   PSA, total and free    Other Visit Diagnoses    Annual physical exam    -  Primary    Prevnar 13 today, next PPV23 >8 weeks. Declined flu. Advised f/u GI Dr Elnoria Howard for colonoscopy as scheduled.    Screening for HIV (human immunodeficiency virus)        Relevant Orders    HIV antibody    Need for prophylactic vaccination against Streptococcus pneumoniae (pneumococcus)        Relevant Orders    Pneumococcal conjugate vaccine 13-valent (Completed)       No orders of the defined types were placed in this encounter.      Follow up plan: Return in about 4 weeks (around 09/28/2015) for Lab results.  Saralyn Pilar, DO [facility], PGY-3

## 2015-08-31 NOTE — Assessment & Plan Note (Signed)
Well controlled. A1c 6.3 to 6.7. Never on insulin No complications or hypoglycemia.  Plan:  1. Continue - Metformin 1000mg  BID and Glipizide 5mg  PO daily 2. Lifestyle Mods - continue improved glut free / DM diet (dec carbs, inc vegs), need to resume regular exercise regimen 2. Ordered future CMET, lipids 3. On ASA 81, never on statin. Prior ASCVD >10% using old lipid panel results, again recommended statin, however defer until follow-up lab result visit to discuss 4. Referral to Ophtho DM eye exam 5. RTC 6 months for A1c, follow-up 4 weeks for lab results statin

## 2015-08-31 NOTE — Assessment & Plan Note (Addendum)
No concerning history of exam findings. Routine screening in high risk population AA male age 63>50 - DRE 08/31/15: enlarged smooth prostate  Plan: 1. Check PSA total and free today, counseled on benefits/risks of screening and potential further work-up with Urology if persistent elevated.

## 2015-08-31 NOTE — Patient Instructions (Signed)
Thank you for coming in to clinic today.  1. Your Diabetes is well controlled - A1c 6.7 (still at goal below 7) keep up the good work, keep taking your medications, work on improving your regular exercise regimen 2. We will check screening blood work for kidney, liver, prostate, cholesterol  - Please schedule a "Lab Only" visit (early morning, 8 to 9:00am) to get your blood work drawn here at our clinic. - You need to be fasting (No food or drink after midnight, and nothing in the morning before your blood draw). - I have already ordered your blood work (orders will be good for few months), so you may schedule this appointment at your convenience.  You got pneumonia vaccine today  Referral to Eye Doctor today for Diabetic Eye Exam  Recommend future colonoscopy as scheduled with Dr Elnoria HowardHung  I would like you to return for a follow-up visit with me to discuss lab results and cholesterol medication, please schedule this appointment anytime 1-3 weeks after you lab visit  If you have any other questions or concerns, please feel free to call the clinic to contact me. You may also schedule an earlier appointment if necessary.  However, if your symptoms get significantly worse, please go to the Emergency Department to seek immediate medical attention.  Saralyn PilarAlexander Kagen Kunath, DO [facility]

## 2015-09-18 ENCOUNTER — Other Ambulatory Visit: Payer: Self-pay | Admitting: Family Medicine

## 2015-09-18 DIAGNOSIS — E119 Type 2 diabetes mellitus without complications: Secondary | ICD-10-CM

## 2015-11-16 ENCOUNTER — Other Ambulatory Visit: Payer: Self-pay | Admitting: *Deleted

## 2015-11-16 DIAGNOSIS — E119 Type 2 diabetes mellitus without complications: Secondary | ICD-10-CM

## 2015-11-16 MED ORDER — LISINOPRIL 10 MG PO TABS: 10.0000 mg | ORAL_TABLET | Freq: Every day | ORAL | Status: DC

## 2016-01-02 IMAGING — CT CT ABD-PELV W/ CM
2 of 5 series · 16 of 46 positions shown, 18 images · IV contrast (OMNIPAQUE)
Comparison: None.

CLINICAL DATA: Subsequent encounter for several year history of
right lower quadrant abdominal pain.

EXAM:
CT ABDOMEN AND PELVIS WITH CONTRAST
TECHNIQUE: Multidetector CT imaging of the abdomen and pelvis was performed
using the standard protocol following bolus administration of
intravenous contrast.
CONTRAST:  100mL OMNIPAQUE IOHEXOL 300 MG/ML  SOLN

[Series 2: rtn a/p with · axial · 0.77mm/px · z∈[-384,+1]mm · 13 of 89 slices shown, 15 images]
[im 6/89  soft-tissue]
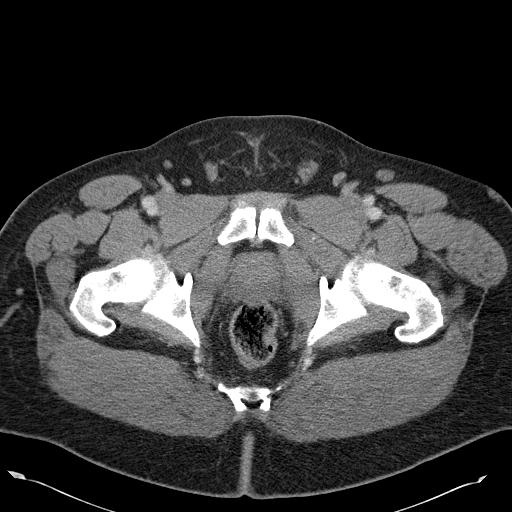
[im 6/89  bone]
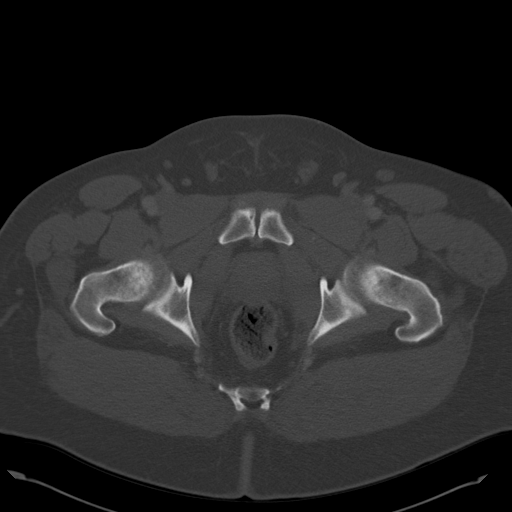
[im 11/89  soft-tissue]
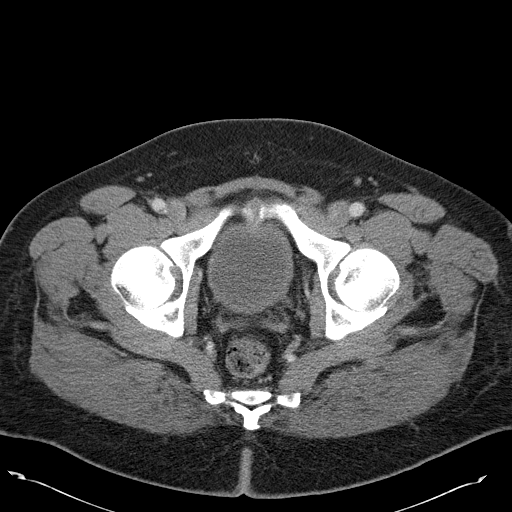
[im 21/89  soft-tissue]
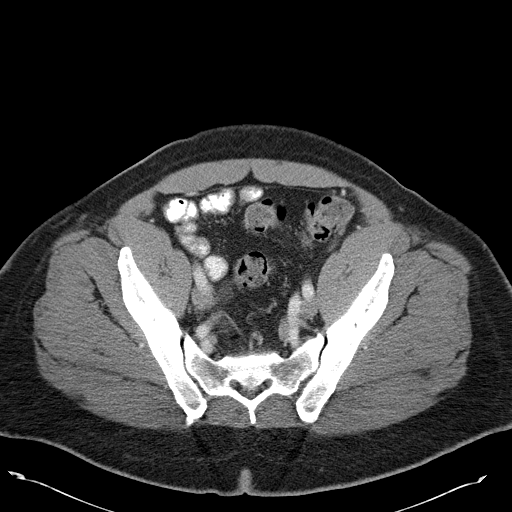
[im 26/89  soft-tissue]
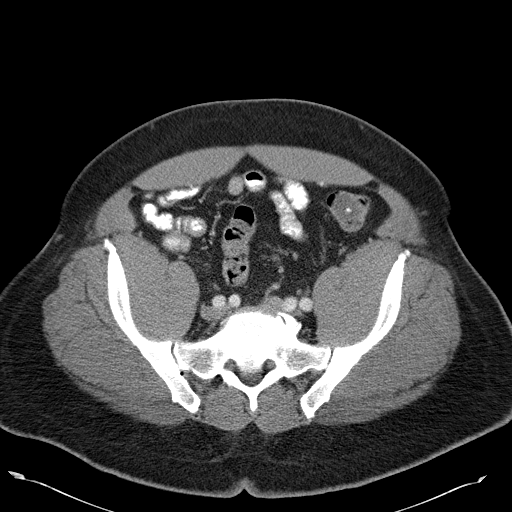
[im 32/89  soft-tissue]
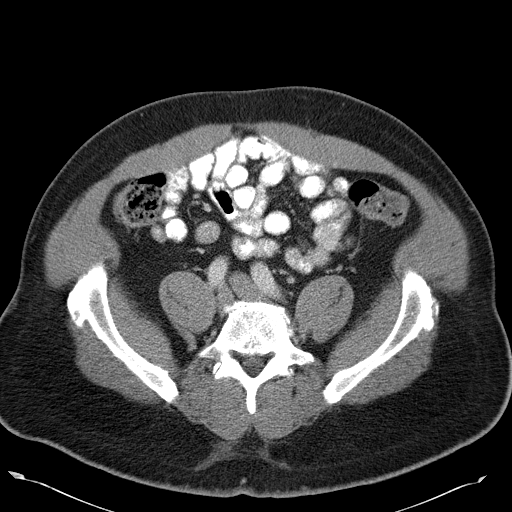
[im 37/89  soft-tissue]
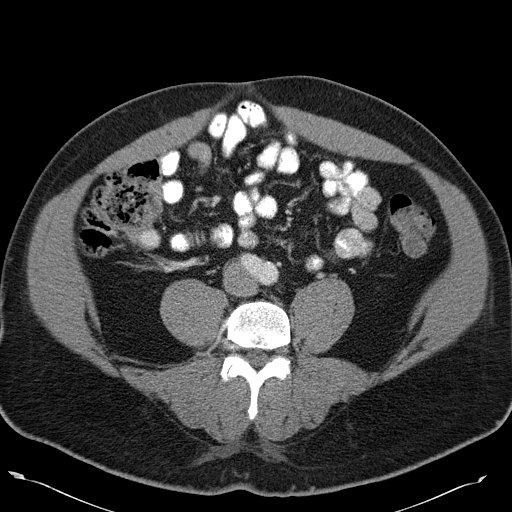
[im 47/89  soft-tissue]
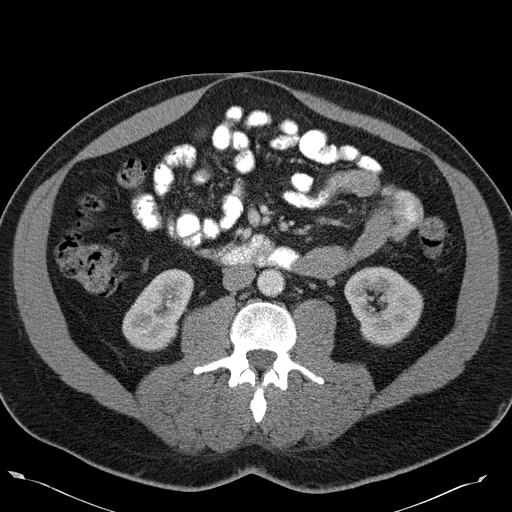
[im 52/89  soft-tissue]
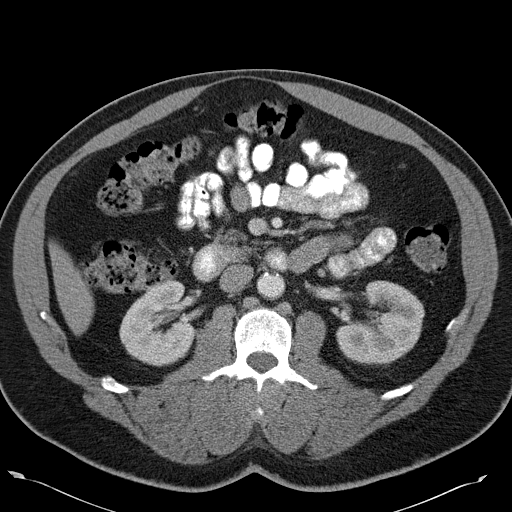
[im 57/89  soft-tissue]
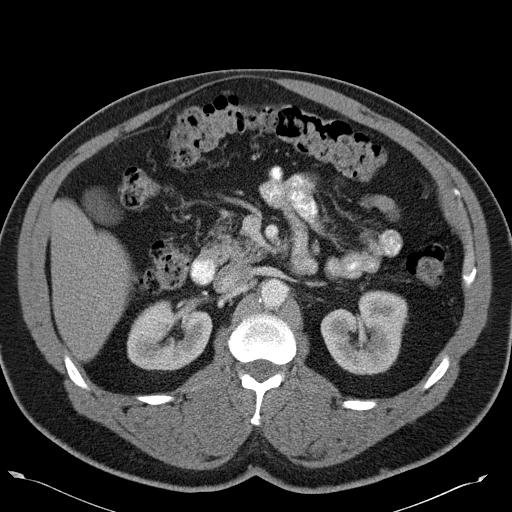
[im 57/89  bone]
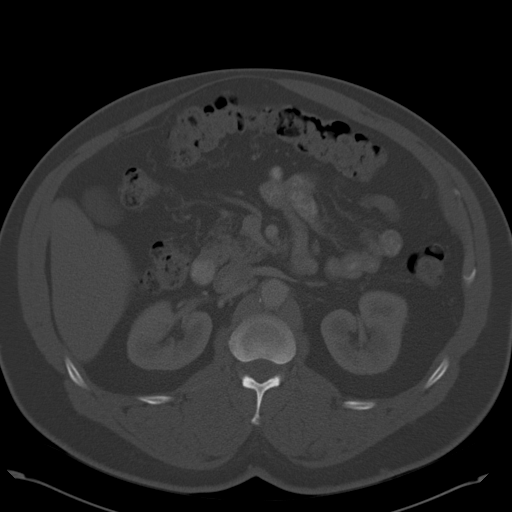
[im 63/89  soft-tissue]
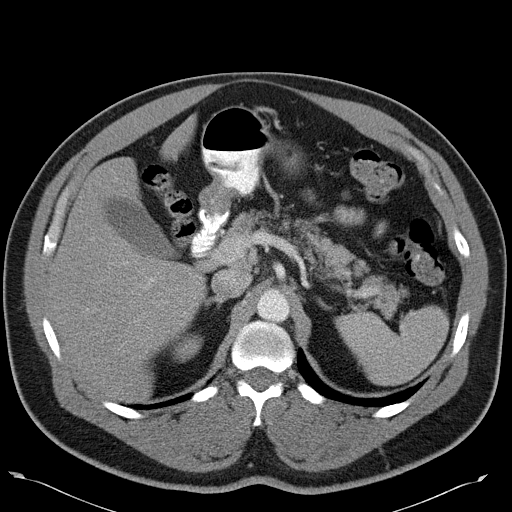
[im 68/89  soft-tissue]
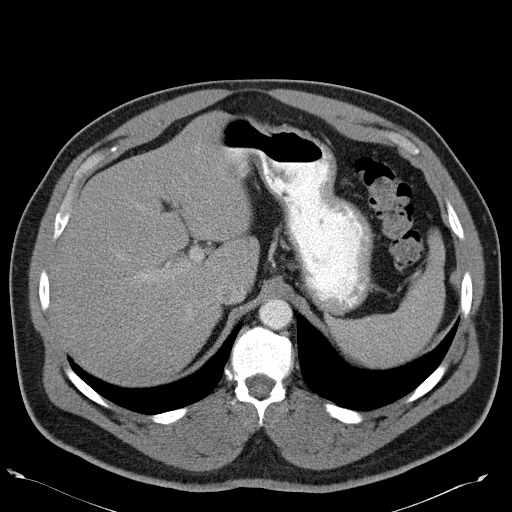
[im 78/89  soft-tissue]
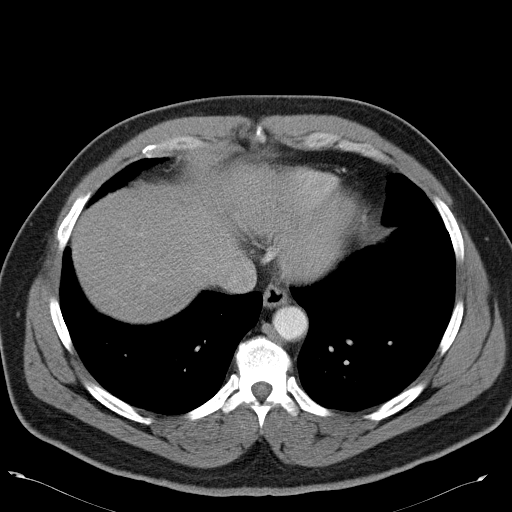
[im 83/89  soft-tissue]
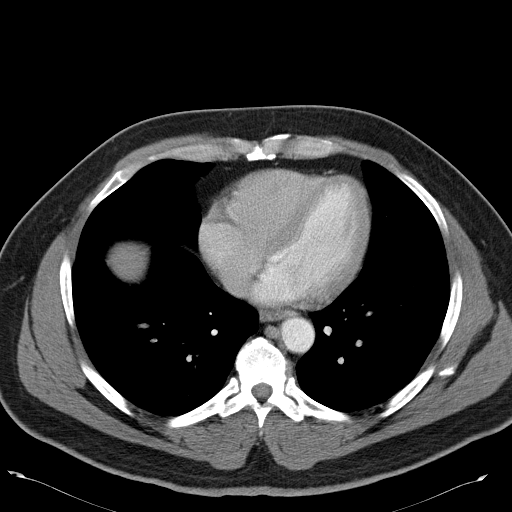

[Series 602: coronal · coronal · 0.87mm/px · 3 of 97 slices shown]
[im 33/97  soft-tissue]
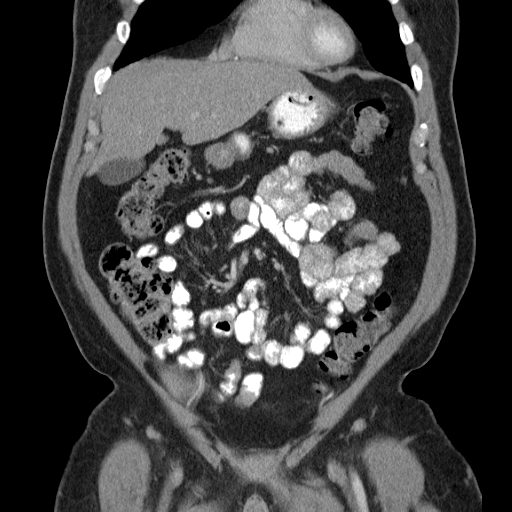
[im 43/97  soft-tissue]
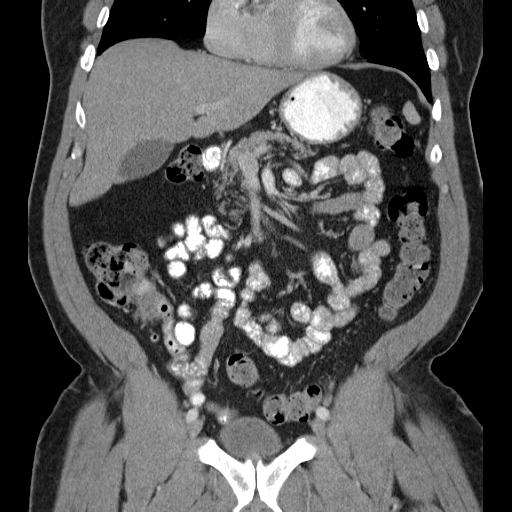
[im 54/97  soft-tissue]
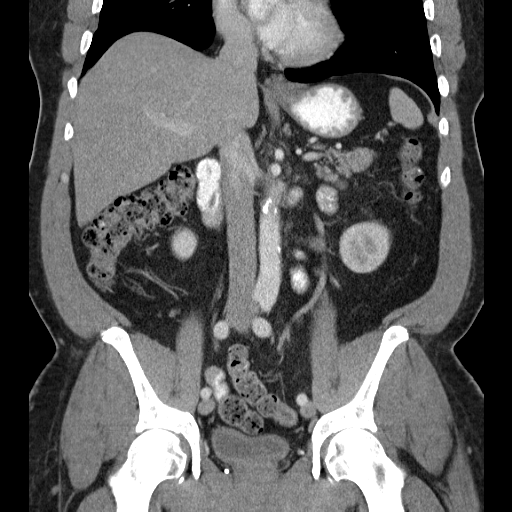

[16 of 46 positions shown; findings below may reference images not displayed]

FINDINGS: Lower chest: 6 mm posterior right lower lobe nodule is seen in the
right costophrenic sulcus.

Hepatobiliary: No focal abnormality within the liver parenchyma.
There is no evidence for gallstones, gallbladder wall thickening, or
pericholecystic fluid. No intrahepatic or extrahepatic biliary
dilation.

Pancreas: No focal mass lesion. No dilatation of the main duct. No
intraparenchymal cyst. No peripancreatic edema.

Spleen: No splenomegaly. No focal mass lesion.

Adrenals/Urinary Tract: No adrenal nodule or mass. There may be a
tiny cortical cyst in the posterior left kidney. Kidneys are
otherwise unremarkable in appearance. No evidence for hydroureter.
Urinary bladder has normal CT imaging features.

Stomach/Bowel: Stomach is nondistended. No gastric wall thickening.
No evidence of outlet obstruction. Duodenum is normally positioned
as is the ligament of Treitz. No small bowel wall thickening. No
small bowel dilatation. Terminal ileum normal. Appendix normal. No
gross colonic mass. No colonic wall thickening. No substantial
diverticular change.

Vascular/Lymphatic: There is abdominal aortic atherosclerosis
without aneurysm.

Reproductive: Prostate gland and seminal vesicles are unremarkable.

Other: No intraperitoneal free fluid.

Musculoskeletal: Bone windows reveal no worrisome lytic or sclerotic
osseous lesions.
IMPRESSION: 1. No findings to explain the patient's history of right-sided
abdominal pain.
2. 6 mm right lower lobe pulmonary nodule. If the patient is at high
risk for bronchogenic carcinoma, follow-up chest CT at 6-12 months
is recommended. If the patient is at low risk for bronchogenic
carcinoma, follow-up chest CT at 12 months is recommended. This
recommendation follows the consensus statement: Guidelines for
Management of Small Pulmonary Nodules Detected on CT Scans: A
Statement from the [HOSPITAL] as published in Radiology
1118;[DATE].
Imaging findings of potential clinical significance:

Aortic Atherosclerosis ([id number]-170.0)

## 2016-03-12 ENCOUNTER — Other Ambulatory Visit: Payer: Self-pay | Admitting: Family Medicine

## 2016-03-12 DIAGNOSIS — E119 Type 2 diabetes mellitus without complications: Secondary | ICD-10-CM

## 2016-03-18 ENCOUNTER — Other Ambulatory Visit: Payer: Self-pay | Admitting: *Deleted

## 2016-03-18 MED ORDER — METFORMIN HCL 1000 MG PO TABS: 1000.0000 mg | ORAL_TABLET | Freq: Two times a day (BID) | ORAL | 5 refills | Status: DC

## 2016-03-22 ENCOUNTER — Other Ambulatory Visit: Payer: Self-pay | Admitting: *Deleted

## 2016-03-22 ENCOUNTER — Telehealth: Payer: Self-pay | Admitting: Internal Medicine

## 2016-03-22 DIAGNOSIS — E119 Type 2 diabetes mellitus without complications: Secondary | ICD-10-CM

## 2016-03-22 MED ORDER — GLIPIZIDE 5 MG PO TABS: 5.0000 mg | ORAL_TABLET | Freq: Every day | ORAL | 1 refills | Status: DC

## 2016-03-22 NOTE — Telephone Encounter (Signed)
Needs prescription refilled -glipizide. [facility] on [address]

## 2016-07-25 ENCOUNTER — Ambulatory Visit (INDEPENDENT_AMBULATORY_CARE_PROVIDER_SITE_OTHER): Payer: Self-pay | Admitting: Internal Medicine

## 2016-07-25 VITALS — BP 120/78 | HR 92 | Temp 97.9°F | Ht 72.0 in | Wt 279.0 lb

## 2016-07-25 DIAGNOSIS — I10 Essential (primary) hypertension: Secondary | ICD-10-CM

## 2016-07-25 DIAGNOSIS — E1169 Type 2 diabetes mellitus with other specified complication: Secondary | ICD-10-CM

## 2016-07-25 DIAGNOSIS — E119 Type 2 diabetes mellitus without complications: Secondary | ICD-10-CM

## 2016-07-25 DIAGNOSIS — E785 Hyperlipidemia, unspecified: Secondary | ICD-10-CM

## 2016-07-25 LAB — POCT GLYCOSYLATED HEMOGLOBIN (HGB A1C): HEMOGLOBIN A1C: 6.8

## 2016-07-25 MED ORDER — GLIPIZIDE 5 MG PO TABS: 5.0000 mg | ORAL_TABLET | Freq: Every day | ORAL | 3 refills | Status: DC

## 2016-07-25 MED ORDER — METFORMIN HCL 1000 MG PO TABS: 1000.0000 mg | ORAL_TABLET | Freq: Two times a day (BID) | ORAL | 11 refills | Status: DC

## 2016-07-25 NOTE — Patient Instructions (Addendum)
It was nice meeting you today Mr. Calvin Newton!  Please continue taking your glipizide and metformin as you were.   For now we will hold off on starting the statin and resuming your blood pressure medication.   Please schedule another appointment at your earliest convenience for a full physical and to have bloodwork drawn. Please schedule this appointment early in the morning before you have eaten anything so we can measure your cholesterol.   If you have any questions or concerns, please feel free to call the clinic.   Be well,  Dr. Natale MilchLancaster

## 2016-07-25 NOTE — Progress Notes (Signed)
   Subjective:   Patient: Calvin Newton       Birthdate: 06/05/1951       MRN: [id number]      HPI  Calvin LionsHarold C Bozzi is a 66 y.o. male presenting for diabetes f/u.   Type 2 DM Patient currently taking glipizide and metformin. Last A1C almost a year ago, but was at goal at 6.7. Patient checks CBGs 2 times daily. Normally has post-prandial CBGs between 110-120. Highest blood sugar was 195 after he ate a lot of sweets. Denies blurry vision or any other symptoms when hyperglycemic. Last eye exam 3 years ago. No numbness or tingling in extremities.   Smoking status reviewed. Patient is never smoker.   Review of Systems See HPI.     Objective:  Physical Exam  Constitutional: He is oriented to person, place, and time and well-developed, well-nourished, and in no distress.  HENT:  Head: Normocephalic and atraumatic.  Eyes: Conjunctivae and EOM are normal. Right eye exhibits no discharge. Left eye exhibits no discharge.  Pulmonary/Chest: Effort normal. No respiratory distress.  Neurological: He is alert and oriented to person, place, and time.  Skin: Skin is warm and dry.  Psychiatric: Affect and judgment normal.   Assessment & Plan:  Hyperlipidemia associated with type 2 diabetes mellitus (HCC) Controlled on current med regimen with A1C 6.8. As such, no need for med changes at this time.  - Continue metformin 1000g BID - Continue glipizide 5mg  qd  - F/u in 3 months for repeat A1C  Essential hypertension Patient previously prescribed lisinopril, though has been out for over three months. BP in office today at goal. As patient at goal without medication, will not resume meds today. - Continue to monitor at future appts   Tarri AbernethyAbigail J Lancaster, MD, MPH PGY-2 [facility] Pager [phone]

## 2016-07-26 ENCOUNTER — Encounter: Payer: Self-pay | Admitting: Internal Medicine

## 2016-07-26 DIAGNOSIS — Z8679 Personal history of other diseases of the circulatory system: Secondary | ICD-10-CM

## 2016-07-26 DIAGNOSIS — I10 Essential (primary) hypertension: Secondary | ICD-10-CM | POA: Insufficient documentation

## 2016-07-26 HISTORY — DX: Personal history of other diseases of the circulatory system: Z86.79

## 2016-07-26 NOTE — Assessment & Plan Note (Signed)
Controlled on current med regimen with A1C 6.8. As such, no need for med changes at this time.  - Continue metformin 1000g BID - Continue glipizide 5mg  qd  - F/u in 3 months for repeat A1C

## 2016-07-26 NOTE — Assessment & Plan Note (Signed)
Patient previously prescribed lisinopril, though has been out for over three months. BP in office today at goal. As patient at goal without medication, will not resume meds today. - Continue to monitor at future appts

## 2017-07-27 ENCOUNTER — Ambulatory Visit: Payer: Self-pay | Admitting: Internal Medicine

## 2017-08-03 ENCOUNTER — Ambulatory Visit: Payer: Self-pay | Admitting: Internal Medicine

## 2017-08-24 ENCOUNTER — Other Ambulatory Visit: Payer: Self-pay

## 2017-08-24 DIAGNOSIS — E119 Type 2 diabetes mellitus without complications: Secondary | ICD-10-CM

## 2017-08-24 MED ORDER — GLIPIZIDE 5 MG PO TABS: 5.0000 mg | ORAL_TABLET | Freq: Every day | ORAL | 3 refills | Status: DC

## 2017-08-24 MED ORDER — METFORMIN HCL 1000 MG PO TABS: 1000.0000 mg | ORAL_TABLET | Freq: Two times a day (BID) | ORAL | 3 refills | Status: DC

## 2017-09-13 ENCOUNTER — Encounter: Payer: Self-pay | Admitting: Internal Medicine

## 2017-09-13 ENCOUNTER — Other Ambulatory Visit: Payer: Self-pay

## 2017-09-13 ENCOUNTER — Ambulatory Visit (INDEPENDENT_AMBULATORY_CARE_PROVIDER_SITE_OTHER): Payer: 59 | Admitting: Internal Medicine

## 2017-09-13 VITALS — BP 108/62 | HR 80 | Temp 97.5°F | Ht 71.5 in | Wt 271.0 lb

## 2017-09-13 DIAGNOSIS — E785 Hyperlipidemia, unspecified: Secondary | ICD-10-CM

## 2017-09-13 DIAGNOSIS — I10 Essential (primary) hypertension: Secondary | ICD-10-CM | POA: Diagnosis not present

## 2017-09-13 DIAGNOSIS — Z1211 Encounter for screening for malignant neoplasm of colon: Secondary | ICD-10-CM

## 2017-09-13 DIAGNOSIS — K449 Diaphragmatic hernia without obstruction or gangrene: Secondary | ICD-10-CM | POA: Diagnosis not present

## 2017-09-13 DIAGNOSIS — E118 Type 2 diabetes mellitus with unspecified complications: Secondary | ICD-10-CM | POA: Diagnosis not present

## 2017-09-13 DIAGNOSIS — R1031 Right lower quadrant pain: Secondary | ICD-10-CM

## 2017-09-13 DIAGNOSIS — Z0000 Encounter for general adult medical examination without abnormal findings: Secondary | ICD-10-CM | POA: Diagnosis not present

## 2017-09-13 DIAGNOSIS — G8929 Other chronic pain: Secondary | ICD-10-CM

## 2017-09-13 DIAGNOSIS — E1169 Type 2 diabetes mellitus with other specified complication: Secondary | ICD-10-CM | POA: Diagnosis not present

## 2017-09-13 LAB — POCT GLYCOSYLATED HEMOGLOBIN (HGB A1C): Hemoglobin A1C: 6.9

## 2017-09-13 MED ORDER — MELOXICAM 15 MG PO TABS: 15.0000 mg | ORAL_TABLET | Freq: Every day | ORAL | 0 refills | Status: DC

## 2017-09-13 NOTE — Patient Instructions (Signed)
It was nice seeing you again today Mr. Calvin Newton!  For pain, you can begin taking one Mobic tablet per day. Do not take ibuprofen, naproxen, or any products containing these medications while you are taking Mobic. It is safe to take Tylenol and Mobic at the same time if needed.   Please continue taking your medications as you have been.   I have placed a referral for a colonoscopy for you today to screen for colon cancer. This is recommended every 10 years, and your last colonoscopy was done in 2004. You will be called by the GI office to schedule your appointment.   We will see you back in about 6 months to check your A1C again.   I will call you if there are any abnormalities with your bloodwork.   If you have any questions or concerns, please feel free to call the clinic.   Be well,  Dr. Natale MilchLancaster

## 2017-09-13 NOTE — Progress Notes (Signed)
67 y.o. year old male presents for well male/preventative visit.  Acute Concerns: Hiatal hernia Known. Present for longer than patient can remember. Thinks it may be getting bigger. Has never been painful.   RLQ pain Ongoing for many years. Describes more as "annoying" than painful. Had CT for this in 2016 that showed no abnormalities. Pain is not different now than it was then. Typically take ibuprofen (400 mg BID) for this pain which helps. Also endorses a numbness that begins where pain starts in RLQ and extends to his neck. No abd pain otherwise. No N/V/C/D. Says that the pain is worse when he presses on that area.   DM Taking metformin and glipizide daily. Last eye exam 07/2017.   Diet: Thinks is relatively healthy. Is eating fewer vegetables than he used to but still thinks he eats plenty. Eats a lot of rotisserie chicken. Normally drinks water, coffee, and occasionally diet ginger ale and lemonade.   Exercise: Exercises regularly. ~1-4 days per week depending on work schedule. Typically cardio on an exercise bike. Retiring soon so should be able to exercise more regularly.    Surgical History: Past Surgical History:  Procedure Laterality Date  . TOTAL KNEE ARTHROPLASTY     Left    Allergies: No Known Allergies  Social:  Social History   Socioeconomic History  . Marital status: Single    Spouse name: Not on file  . Number of children: Not on file  . Years of education: Not on file  . Highest education level: Not on file  Occupational History    Employer: OTHER  Social Needs  . Financial resource strain: Not on file  . Food insecurity:    Worry: Not on file    Inability: Not on file  . Transportation needs:    Medical: Not on file    Non-medical: Not on file  Tobacco Use  . Smoking status: Never Smoker  . Smokeless tobacco: Never Used  Substance and Sexual Activity  . Alcohol use: No  . Drug use: No  . Sexual activity: Not on file  Lifestyle  . Physical  activity:    Days per week: Not on file    Minutes per session: Not on file  . Stress: Not on file  Relationships  . Social connections:    Talks on phone: Not on file    Gets together: Not on file    Attends religious service: Not on file    Active member of club or organization: Not on file    Attends meetings of clubs or organizations: Not on file    Relationship status: Not on file  Other Topics Concern  . Not on file  Social History Narrative   Does not use tobacco, EtOH, or Drugs.  Employed by [employer] and is exposed to the sun frequently.  Exercising more, 60 minutes total of bike, elliptical and free weights nightly    Immunization: Immunization History  Administered Date(s) Administered  . Influenza,inj,Quad PF,6+ Mos 02/11/2013, 03/19/2014  . Pneumococcal Conjugate-13 08/31/2015  . Tdap 03/22/2011    Cancer Screening:  Colonoscopy: Last 2004. Will consider having another one performed.   Physical Exam: VITALS: Reviewed GEN: Pleasant male, NAD HEENT: Normocephalic, PERRL, EOMI, no scleral icterus, bilateral TM pearly grey, nasal septum midline, MMM, uvula midline, no anterior or posterior lymphadenopathy, no thyromegaly CARDIAC: RRR, S1 and S2 present, no murmur, no heaves/thrills RESP: CTAB, normal effort ABD: Soft, no tenderness to palpation of RLQ, normal bowel sounds.  Moderate sized hiatal and umbilical hernia present but non-tender and reducible.  EXT: No edema SKIN: Warm and dry, no rash NEURO: A&Ox3, 5/5 strength upper and lower extremities bilaterally PSYCH: Appropriate mood and affect  ASSESSMENT & PLAN: 67 y.o. male presents for annual well male/preventative exam. Please see problem specific assessment and plan.   Essential hypertension Well-controlled with exercise and diet control alone. At goal today.  - Continue to monitor - BMP today  Diabetes type 2, controlled (HCC) Remains well-controlled with A1C at 6.9 today. Last eye appt  07/2017.  - Continue glipizide and metformin - Return in 65mo for repeat A1C  Hyperlipidemia associated with type 2 diabetes mellitus (HCC) - Lipid panel today  Abdominal pain, chronic, right lower quadrant Significant work-up with no changes today. Likely MSK as patient says pain is worse when pressing down on the affected area. Non-tender to palpation today. Rather than taking ibuprofen daily, will begin trial of Mobic today.   Hiatal hernia With umbilical hernia as well. Present for many years. Thinks may be enlarging, however not painful and reducible. As such, will continue to monitor. Can refer to surgery if becomes problematic.    Tarri Abernethy, MD, MPH PGY-3 [facility] Pager [phone]

## 2017-09-13 NOTE — Assessment & Plan Note (Signed)
Significant work-up with no changes today. Likely MSK as patient says pain is worse when pressing down on the affected area. Non-tender to palpation today. Rather than taking ibuprofen daily, will begin trial of Mobic today.

## 2017-09-13 NOTE — Assessment & Plan Note (Signed)
With umbilical hernia as well. Present for many years. Thinks may be enlarging, however not painful and reducible. As such, will continue to monitor. Can refer to surgery if becomes problematic.

## 2017-09-13 NOTE — Assessment & Plan Note (Signed)
Remains well-controlled with A1C at 6.9 today. Last eye appt 07/2017.  - Continue glipizide and metformin - Return in 38mo for repeat A1C

## 2017-09-13 NOTE — Assessment & Plan Note (Signed)
Lipid panel today

## 2017-09-13 NOTE — Assessment & Plan Note (Addendum)
Well-controlled with exercise and diet control alone. At goal today.  - Continue to monitor - BMP today

## 2017-09-14 ENCOUNTER — Telehealth: Payer: Self-pay

## 2017-09-14 LAB — LIPID PANEL
CHOL/HDL RATIO: 3.4 ratio (ref 0.0–5.0)
Cholesterol, Total: 159 mg/dL (ref 100–199)
HDL: 47 mg/dL (ref >39)
LDL Calculated: 59 mg/dL (ref 0–99)
TRIGLYCERIDES: 267 mg/dL — AB (ref 0–149)
VLDL Cholesterol Cal: 53 mg/dL — ABNORMAL HIGH (ref 5–40)

## 2017-09-14 LAB — CBC
Hematocrit: 38.6 % (ref 37.5–51.0)
Hemoglobin: 12.8 g/dL — ABNORMAL LOW (ref 13.0–17.7)
MCH: 30.3 pg (ref 26.6–33.0)
MCHC: 33.2 g/dL (ref 31.5–35.7)
MCV: 92 fL (ref 79–97)
PLATELETS: 194 10*3/uL (ref 150–379)
RBC: 4.22 x10E6/uL (ref 4.14–5.80)
RDW: 14.8 % (ref 12.3–15.4)
WBC: 4.4 10*3/uL (ref 3.4–10.8)

## 2017-09-14 LAB — BASIC METABOLIC PANEL
BUN / CREAT RATIO: 14 (ref 10–24)
BUN: 14 mg/dL (ref 8–27)
CHLORIDE: 100 mmol/L (ref 96–106)
CO2: 24 mmol/L (ref 20–29)
Calcium: 9.7 mg/dL (ref 8.6–10.2)
Creatinine, Ser: 0.97 mg/dL (ref 0.76–1.27)
GFR calc non Af Amer: 82 mL/min/{1.73_m2} (ref >59)
GFR, EST AFRICAN AMERICAN: 94 mL/min/{1.73_m2} (ref >59)
GLUCOSE: 154 mg/dL — AB (ref 65–99)
POTASSIUM: 4.7 mmol/L (ref 3.5–5.2)
Sodium: 138 mmol/L (ref 134–144)

## 2017-09-14 LAB — HEPATITIS C ANTIBODY

## 2017-09-14 NOTE — Telephone Encounter (Signed)
Pt called nurse line, would like for Dr. Natale MilchLancaster to advise if he can take probiotic with his current "health conditions" and if so what brand does she recommend for him. Will forward to MD for review Shawna OrleansMeredith B Jodell Weitman, RN

## 2017-09-15 NOTE — Telephone Encounter (Signed)
Called patient again. No answer. Left voicemail explaining that labs were fine (triglycerides are elevated but not dangerously so, and ASCVD risk low, so will not begin statin). Also addressed question of probiotics. Informed patient that probiotics do not have great data to support their use, so I do not have a specific probiotic brand to recommend as I do not routinely recommend using them, however they are not likely to interact with any of his current medicines (glipizide and metformin). Encouraged to call back with any questions.   Tarri AbernethyAbigail J Kristion Holifield, MD, MPH PGY-3 [facility] Pager [phone]

## 2017-09-15 NOTE — Telephone Encounter (Signed)
Returned patient's call. No answer. Will call again later.   Tarri AbernethyAbigail J Yaeli Hartung, MD, MPH PGY-3 [facility] Pager [phone]

## 2017-10-13 ENCOUNTER — Encounter: Payer: Self-pay | Admitting: Internal Medicine

## 2017-11-09 ENCOUNTER — Other Ambulatory Visit: Payer: Self-pay | Admitting: Internal Medicine

## 2017-12-17 ENCOUNTER — Other Ambulatory Visit: Payer: Self-pay | Admitting: Family Medicine

## 2017-12-18 NOTE — Telephone Encounter (Signed)
F/u appt in October to re-evaluate RLQ pain with Mobic

## 2017-12-18 NOTE — Telephone Encounter (Signed)
Refilled rx. Please schedule f/u appt for sometime in October for reevaluation of pain

## 2017-12-19 NOTE — Telephone Encounter (Signed)
LVM to have pt call office back to let him know that his Rx was filled and that his PCP would like him to make an appointment in October to have a follow up for pain. Lamonte SakaiZimmerman Rumple, April D, [place]

## 2018-01-13 ENCOUNTER — Emergency Department (HOSPITAL_COMMUNITY): Payer: 59

## 2018-01-13 ENCOUNTER — Emergency Department (HOSPITAL_COMMUNITY)
Admission: EM | Admit: 2018-01-13 | Discharge: 2018-01-13 | Disposition: A | Discharge status: Home / Self Care | Payer: 59 | Attending: Emergency Medicine | Admitting: Emergency Medicine

## 2018-01-13 ENCOUNTER — Other Ambulatory Visit: Payer: Self-pay

## 2018-01-13 ENCOUNTER — Encounter (HOSPITAL_COMMUNITY): Payer: Self-pay | Admitting: Emergency Medicine

## 2018-01-13 DIAGNOSIS — S3991XA Unspecified injury of abdomen, initial encounter: Secondary | ICD-10-CM | POA: Insufficient documentation

## 2018-01-13 DIAGNOSIS — S0003XA Contusion of scalp, initial encounter: Secondary | ICD-10-CM | POA: Insufficient documentation

## 2018-01-13 DIAGNOSIS — Z7982 Long term (current) use of aspirin: Secondary | ICD-10-CM | POA: Insufficient documentation

## 2018-01-13 DIAGNOSIS — Z23 Encounter for immunization: Secondary | ICD-10-CM | POA: Insufficient documentation

## 2018-01-13 DIAGNOSIS — Z96652 Presence of left artificial knee joint: Secondary | ICD-10-CM | POA: Diagnosis not present

## 2018-01-13 DIAGNOSIS — I10 Essential (primary) hypertension: Secondary | ICD-10-CM | POA: Diagnosis not present

## 2018-01-13 DIAGNOSIS — Y999 Unspecified external cause status: Secondary | ICD-10-CM | POA: Diagnosis not present

## 2018-01-13 DIAGNOSIS — Y9389 Activity, other specified: Secondary | ICD-10-CM | POA: Insufficient documentation

## 2018-01-13 DIAGNOSIS — S0990XA Unspecified injury of head, initial encounter: Secondary | ICD-10-CM | POA: Diagnosis present

## 2018-01-13 DIAGNOSIS — Z7984 Long term (current) use of oral hypoglycemic drugs: Secondary | ICD-10-CM | POA: Diagnosis not present

## 2018-01-13 DIAGNOSIS — E119 Type 2 diabetes mellitus without complications: Secondary | ICD-10-CM | POA: Diagnosis not present

## 2018-01-13 DIAGNOSIS — Y92410 Unspecified street and highway as the place of occurrence of the external cause: Secondary | ICD-10-CM | POA: Diagnosis not present

## 2018-01-13 LAB — BASIC METABOLIC PANEL
ANION GAP: 10 (ref 5–15)
BUN: 18 mg/dL (ref 8–23)
CALCIUM: 9.1 mg/dL (ref 8.9–10.3)
CO2: 23 mmol/L (ref 22–32)
Chloride: 105 mmol/L (ref 98–111)
Creatinine, Ser: 1.13 mg/dL (ref 0.61–1.24)
GFR calc Af Amer: >60 mL/min (ref >60)
GLUCOSE: 109 mg/dL — AB (ref 70–99)
Potassium: 4.5 mmol/L (ref 3.5–5.1)
Sodium: 138 mmol/L (ref 135–145)

## 2018-01-13 LAB — CBC
HCT: 40.8 % (ref 39.0–52.0)
HEMOGLOBIN: 13.3 g/dL (ref 13.0–17.0)
MCH: 29.9 pg (ref 26.0–34.0)
MCHC: 32.6 g/dL (ref 30.0–36.0)
MCV: 91.7 fL (ref 78.0–100.0)
PLATELETS: 163 10*3/uL (ref 150–400)
RBC: 4.45 MIL/uL (ref 4.22–5.81)
RDW: 13.3 % (ref 11.5–15.5)
WBC: 8.8 10*3/uL (ref 4.0–10.5)

## 2018-01-13 LAB — PROTIME-INR
INR: 0.9
PROTHROMBIN TIME: 12.1 s (ref 11.4–15.2)

## 2018-01-13 MED ORDER — TETANUS-DIPHTH-ACELL PERTUSSIS 5-2.5-18.5 LF-MCG/0.5 IM SUSP
0.5000 mL | Freq: Once | INTRAMUSCULAR | Status: AC
  Administered 2018-01-13: 0.5 mL via INTRAMUSCULAR
  Filled 2018-01-13: qty 0.5

## 2018-01-13 MED ORDER — TRAMADOL HCL 50 MG PO TABS: 50.0000 mg | ORAL_TABLET | Freq: Four times a day (QID) | ORAL | 0 refills | Status: DC | PRN

## 2018-01-13 MED ORDER — IOHEXOL 300 MG/ML  SOLN
100.0000 mL | Freq: Once | INTRAMUSCULAR | Status: AC | PRN
  Administered 2018-01-13: 100 mL via INTRAVENOUS

## 2018-01-13 MED ORDER — BACITRACIN ZINC 500 UNIT/GM EX OINT
1.0000 "application " | TOPICAL_OINTMENT | Freq: Once | CUTANEOUS | Status: AC
  Administered 2018-01-13: 1 via TOPICAL
  Filled 2018-01-13: qty 0.9

## 2018-01-13 NOTE — ED Notes (Signed)
Ice pack applied.
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
## 2018-01-13 NOTE — ED Notes (Signed)
Patient transported to CT 

## 2018-01-13 NOTE — ED Notes (Signed)
Patient transported to X-ray 

## 2018-01-13 NOTE — Discharge Instructions (Addendum)
You can take over-the-counter medications as needed for pain, I also prescribed Ultram that she can use for more severe pain, apply ice to help with the swelling, he will feel stiff and sore for the next week or so, return to the emergency room as needed  The CT scan did show an incidental finding in the colon by the ileocecal valve.   The radiologist recommended an outpatient colonoscopy to ensure there is no mass.  Follow up with your primary care doctor to discuss a routine referral

## 2018-01-16 ENCOUNTER — Other Ambulatory Visit: Payer: Self-pay

## 2018-01-16 ENCOUNTER — Telehealth: Payer: Self-pay | Admitting: Family Medicine

## 2018-01-16 ENCOUNTER — Ambulatory Visit (INDEPENDENT_AMBULATORY_CARE_PROVIDER_SITE_OTHER): Payer: 59 | Admitting: Family Medicine

## 2018-01-16 VITALS — BP 128/70 | HR 94 | Temp 98.6°F | Ht 71.5 in | Wt 266.0 lb

## 2018-01-16 DIAGNOSIS — M26621 Arthralgia of right temporomandibular joint: Secondary | ICD-10-CM | POA: Diagnosis not present

## 2018-01-16 NOTE — Assessment & Plan Note (Addendum)
Acute.  Patient appears to show signs of possible trismus.  Will need to monitor closely.  Initially ordered CT maxillofacial without contrast, though image was denied.  Discussed with radiologist who reviewed prior CT head and neck noting intact mandibular condyles making subluxation on likely with chronic arthritic changes to left TMJ sparing right. - Discussed with patient over phone (see note) about pursuing further imaging which would include MRI of TMJ if symptoms worsen or persist - Reviewed return precautions - RTC 1 week to reevaluate for possible trismus - Patient needs to undergo colonoscopy for suspicious incidental ileocecal lesion will need to be addressed at next follow-up
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
## 2018-01-16 NOTE — Patient Instructions (Signed)
Thank you for coming in to see us today. Please see below to review our plan for today's visit.  [facility].  I will call you regarding the results.  We would like to see you again in approximately 1 week.  Please call the clinic at [phone] if your symptoms worsen or you have any concerns. It was our pleasure to serve you.  Durward Parcelavid McMullen, DO [facility], PGY-3
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
## 2018-01-23 ENCOUNTER — Ambulatory Visit (INDEPENDENT_AMBULATORY_CARE_PROVIDER_SITE_OTHER): Payer: 59 | Admitting: Family Medicine

## 2018-01-23 ENCOUNTER — Encounter: Payer: Self-pay | Admitting: Family Medicine

## 2018-01-23 VITALS — BP 127/80 | HR 85 | Temp 98.1°F | Ht 71.5 in | Wt 267.0 lb

## 2018-01-23 DIAGNOSIS — K6389 Other specified diseases of intestine: Secondary | ICD-10-CM | POA: Diagnosis not present

## 2018-01-23 DIAGNOSIS — M26621 Arthralgia of right temporomandibular joint: Secondary | ICD-10-CM

## 2018-01-23 NOTE — Patient Instructions (Signed)
Thank you for coming in to see us today. Please see below to review our plan for today's visit.  1.  Your jaw seems to be improving.  I would like for you to return to the clinic in approximately 1 month to be reevaluated. 2.  Your CT in the emergency room incidentally found a region on your small intestine which may be a mass versus enlarged valve which needs to be evaluated by a colonoscopy.  I have placed a referral for GI and they will contact you over the next few weeks to schedule the appointment.  Please call the clinic at [phone] if your symptoms worsen or you have any concerns. It was our pleasure to serve you.  Durward Parcelavid Izaih Kataoka, DO [facility], PGY-3

## 2018-01-23 NOTE — Assessment & Plan Note (Signed)
Incidental finding as of 01/13/2018.  Suspicious for mass versus enlarged ileocecal valve.  Patient asymptomatic. - Ambulatory referral to GI for diagnostic colonoscopy
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
## 2018-01-29 ENCOUNTER — Encounter: Payer: Self-pay | Admitting: Physician Assistant

## 2018-01-29 ENCOUNTER — Telehealth: Payer: Self-pay | Admitting: Internal Medicine

## 2018-01-29 NOTE — Telephone Encounter (Signed)
Pt scheduled to see Willette ClusterPaula Guenther NP 02/13/18@3pm , please notify pt of appt date and time.

## 2018-01-29 NOTE — Telephone Encounter (Signed)
Left message for patient to call back to confirm.

## 2018-01-29 NOTE — Telephone Encounter (Signed)
Pt could not make it to appt on 02/13/18. R/s to 03/05/18 with Amy.

## 2018-02-13 ENCOUNTER — Ambulatory Visit: Payer: 59 | Admitting: Nurse Practitioner

## 2018-03-05 ENCOUNTER — Encounter: Payer: Self-pay | Admitting: Physician Assistant

## 2018-03-05 ENCOUNTER — Ambulatory Visit: Payer: 59 | Admitting: Physician Assistant

## 2018-03-05 ENCOUNTER — Encounter (INDEPENDENT_AMBULATORY_CARE_PROVIDER_SITE_OTHER): Payer: Self-pay

## 2018-03-05 VITALS — BP 110/62 | HR 80 | Ht 71.5 in | Wt 272.2 lb

## 2018-03-05 DIAGNOSIS — R1031 Right lower quadrant pain: Secondary | ICD-10-CM | POA: Diagnosis not present

## 2018-03-05 DIAGNOSIS — R9389 Abnormal findings on diagnostic imaging of other specified body structures: Secondary | ICD-10-CM

## 2018-03-05 MED ORDER — PEG-KCL-NACL-NASULF-NA ASC-C 140 G PO SOLR: 1.0000 | Freq: Once | ORAL | 0 refills | Status: AC

## 2018-03-05 NOTE — Patient Instructions (Addendum)
You have been scheduled for a colonoscopy. Please follow written instructions given to you at your visit today.  Please pick up your prep supplies at the pharmacy within the next 1-3 days. [facility] [address]   Please call us back if you think you could have a sooner appointment. We can look at the schedule. If you use inhalers (even only as needed), please bring them with you on the day of your procedure. Your physician has requested that you go to www.startemmi.com and enter the access code given to you at your visit today. This web site gives a general overview about your procedure. However, you should still follow specific instructions given to you by our office regarding your preparation for the procedure.
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
## 2018-03-18 NOTE — Progress Notes (Signed)
Reviewed and agree with documentation and assessment and plan. K. Veena Nandigam , MD   

## 2018-03-19 ENCOUNTER — Ambulatory Visit (INDEPENDENT_AMBULATORY_CARE_PROVIDER_SITE_OTHER): Payer: 59 | Admitting: Family Medicine

## 2018-03-19 DIAGNOSIS — M26621 Arthralgia of right temporomandibular joint: Secondary | ICD-10-CM

## 2018-03-19 NOTE — Patient Instructions (Signed)
Jaw Range of Motion Exercises Jaw range of motion exercises are exercises that help your jaw to move better. These exercises can help to prevent:  Difficulty opening your mouth.  Pain in your jaw while it is both open and closed.  What should I be careful of when doing jaw exercises? Make sure that you only do jaw exercises as directed by your health care provider. You should only move your jaw as far as it can go in each direction, if told to do so by your health care provider. Do not move your jaw into positions that cause you any pain. What exercises should I do?  Stick your jaw forward. Hold this position for 1-2 seconds. Allow your jaw to return to its normal position and rest it there for 1-2 seconds. Do this exercise 8 times.  Stand or sit in front of a mirror. Place your tongue on the roof of your mouth, just behind your top teeth. Slowly open and close your jaw, keeping your tongue on the roof of your mouth. While you open and close your mouth, try to keep your jaw from moving toward one side or the other. Repeat this 8 times.  Move your jaw right. Hold this position for 1-2 seconds. Allow your jaw to return to its normal position, and rest it there for 1-2 seconds. Do this exercise 8 times.  Move your jaw left. Hold this position for 1-2 seconds. Allow your jaw to return to its normal position, and rest it there for 1-2 seconds. Do this exercise 8 times.  Open your mouth as far as it is can comfortably go. Hold this position for 1-2 seconds. Then close your mouth and rest for 1-2 seconds. Do this exercise 8 times.  Move your jaw in a circular motion, starting toward the right (clockwise). Repeat this 8 times.  Move your jaw in a circular motion, starting toward the left (counterclockwise). Repeat this 8 times. Apply moist heat packs or ice packs to your jaw before or after performing your exercises as directed by your health care provider. What else can I do? Avoid the following,  if they cause jaw pain or they increase your jaw pain:  Chewing gum.  Clenching your jaw or teeth or keeping tension in your jaw muscles.  Leaning on your jaw, such as resting your jaw in your hand while leaning on a desk.  This information is not intended to replace advice given to you by your health care provider. Make sure you discuss any questions you have with your health care provider. Document Released: 05/12/2008 Document Revised: 11/05/2015 Document Reviewed: 04/30/2014 Elsevier Interactive Patient Education  2018 Elsevier Inc.  

## 2018-03-19 NOTE — Assessment & Plan Note (Signed)
Significantly improved since last office visit.  Patient able to open mouth greater than 5 cm compared to prior office visit when he was only able to open about 3 cm.  She denies any pain.  Patient will defer MRI.  Will follow up on as-needed basis.  Patient given TMJ exercises.
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
## 2018-04-17 ENCOUNTER — Encounter: Payer: 59 | Admitting: Gastroenterology

## 2018-04-30 ENCOUNTER — Encounter: Payer: Self-pay | Admitting: Gastroenterology

## 2018-04-30 ENCOUNTER — Ambulatory Visit (AMBULATORY_SURGERY_CENTER): Payer: 59 | Admitting: Gastroenterology

## 2018-04-30 VITALS — BP 118/91 | HR 75 | Temp 97.5°F | Resp 13

## 2018-04-30 DIAGNOSIS — R1031 Right lower quadrant pain: Secondary | ICD-10-CM

## 2018-04-30 DIAGNOSIS — K648 Other hemorrhoids: Secondary | ICD-10-CM

## 2018-04-30 DIAGNOSIS — K5730 Diverticulosis of large intestine without perforation or abscess without bleeding: Secondary | ICD-10-CM | POA: Diagnosis present

## 2018-04-30 DIAGNOSIS — R933 Abnormal findings on diagnostic imaging of other parts of digestive tract: Secondary | ICD-10-CM

## 2018-04-30 MED ORDER — SODIUM CHLORIDE 0.9 % IV SOLN: 500.0000 mL | Freq: Once | INTRAVENOUS | Status: DC

## 2018-04-30 NOTE — Patient Instructions (Signed)
Discharge instructions given. Handouts on diverticulosis and hemorroids. Resume previous medications. YOU HAD AN ENDOSCOPIC PROCEDURE TODAY AT THE [facility]:   Refer to the procedure report that was given to you for any specific questions about what was found during the examination.  If the procedure report does not answer your questions, please call your gastroenterologist to clarify.  If you requested that your care partner not be given the details of your procedure findings, then the procedure report has been included in a sealed envelope for you to review at your convenience later.  YOU SHOULD EXPECT: Some feelings of bloating in the abdomen. Passage of more gas than usual.  Walking can help get rid of the air that was put into your GI tract during the procedure and reduce the bloating. If you had a lower endoscopy (such as a colonoscopy or flexible sigmoidoscopy) you may notice spotting of blood in your stool or on the toilet paper. If you underwent a bowel prep for your procedure, you may not have a normal bowel movement for a few days.  Please Note:  You might notice some irritation and congestion in your nose or some drainage.  This is from the oxygen used during your procedure.  There is no need for concern and it should clear up in a day or so.  SYMPTOMS TO REPORT IMMEDIATELY:   Following lower endoscopy (colonoscopy or flexible sigmoidoscopy):  Excessive amounts of blood in the stool  Significant tenderness or worsening of abdominal pains  Swelling of the abdomen that is new, acute  Fever of 100F or higher   For urgent or emergent issues, a gastroenterologist can be reached at any hour by calling [phone].   DIET:  We do recommend a small meal at first, but then you may proceed to your regular diet.  Drink plenty of fluids but you should avoid alcoholic beverages for 24 hours.  ACTIVITY:  You should plan to take it easy for the rest of today and you should NOT  DRIVE or use heavy machinery until tomorrow (because of the sedation medicines used during the test).    FOLLOW UP: Our staff will call the number listed on your records the next business day following your procedure to check on you and address any questions or concerns that you may have regarding the information given to you following your procedure. If we do not reach you, we will leave a message.  However, if you are feeling well and you are not experiencing any problems, there is no need to return our call.  We will assume that you have returned to your regular daily activities without incident.  If any biopsies were taken you will be contacted by phone or by letter within the next 1-3 weeks.  Please call us at [phone] if you have not heard about the biopsies in 3 weeks.    SIGNATURES/CONFIDENTIALITY: You and/or your care partner have signed paperwork which will be entered into your electronic medical record.  These signatures attest to the fact that that the information above on your After Visit Summary has been reviewed and is understood.  Full responsibility of the confidentiality of this discharge information lies with you and/or your care-partner.

## 2018-04-30 NOTE — Progress Notes (Signed)
Pt's states no medical or surgical changes since previsit or office visit. 

## 2018-04-30 NOTE — Op Note (Signed)
[facility] Patient Name: Calvin Newton Procedure Date: 04/30/2018 8:52 AM MRN: [id number] Endoscopist: Napoleon Form , MD Age: 67 Referring MD:  Date of Birth: [birth year] Gender: Male Account #: [id number] Procedure:                Colonoscopy Indications:              Abnormal CT of the GI tract. Thickened IC valve                            versus prominent soft tissue in cecum and ascending                            colon on CT scan August 2019. Last colonoscopy 2010                            normal by Dr. Elnoria Howard Medicines:                Monitored Anesthesia Care Procedure:                Pre-Anesthesia Assessment:                           - Prior to the procedure, a History and Physical                            was performed, and patient medications and                            allergies were reviewed. The patient's tolerance of                            previous anesthesia was also reviewed. The risks                            and benefits of the procedure and the sedation                            options and risks were discussed with the patient.                            All questions were answered, and informed consent                            was obtained. Prior Anticoagulants: The patient has                            taken no previous anticoagulant or antiplatelet                            agents. ASA Grade Assessment: III - A patient with                            severe systemic disease. After reviewing the risks  and benefits, the patient was deemed in                            satisfactory condition to undergo the procedure.                           After obtaining informed consent, the colonoscope                            was passed under direct vision. Throughout the                            procedure, the patient's blood pressure, pulse, and                            oxygen saturations were monitored  continuously. The                            Colonoscope was introduced through the anus and                            advanced to the the terminal ileum, with                            identification of the appendiceal orifice and IC                            valve. The colonoscopy was performed without                            difficulty. The patient tolerated the procedure                            well. The quality of the bowel preparation was                            good. The terminal ileum, ileocecal valve,                            appendiceal orifice, and rectum were photographed. Scope In: 9:03:58 AM Scope Out: 9:17:38 AM Scope Withdrawal Time: 0 hours 7 minutes 46 seconds  Total Procedure Duration: 0 hours 13 minutes 40 seconds  Findings:                 The perianal and digital rectal examinations were                            normal.                           The ileocecal valve was mildly lipomatous.                           The terminal ileum appeared normal.  Scattered small-mouthed diverticula were found in                            the sigmoid colon, descending colon, transverse                            colon and ascending colon.                           Non-bleeding internal hemorrhoids were found during                            retroflexion. The hemorrhoids were medium-sized. Complications:            No immediate complications. Estimated Blood Loss:     Estimated blood loss was minimal. Impression:               - Lipomatous ileocecal valve.                           - The examined portion of the ileum was normal.                           - Diverticulosis in the sigmoid colon, in the                            descending colon, in the transverse colon and in                            the ascending colon.                           - Non-bleeding internal hemorrhoids.                           - No specimens  collected. Recommendation:           - Patient has a contact number available for                            emergencies. The signs and symptoms of potential                            delayed complications were discussed with the                            patient. Return to normal activities tomorrow.                            Written discharge instructions were provided to the                            patient.                           - Resume previous diet.                           -  Continue present medications.                           - Repeat colonoscopy in 10 years for screening                            purposes. Napoleon Form, MD 04/30/2018 9:22:40 AM This report has been signed electronically.

## 2018-04-30 NOTE — Progress Notes (Signed)
PT taken to PACU. Monitors in place. VSS. Report given to RN. 

## 2018-05-01 ENCOUNTER — Telehealth: Payer: Self-pay

## 2018-05-01 NOTE — Telephone Encounter (Signed)
  Follow up Call-  Call back number 04/30/2018  Post procedure Call Back phone  # [phone]  Permission to leave phone message Yes  Some recent data might be hidden     Patient questions:  Do you have a fever, pain , or abdominal swelling? No. Pain Score  0 *  Have you tolerated food without any problems? Yes.    Have you been able to return to your normal activities? Yes.    Do you have any questions about your discharge instructions: Diet   No. Medications  No. Follow up visit  No.  Do you have questions or concerns about your Care? No.  Actions: * If pain score is 4 or above: No action needed, pain <4.

## 2018-08-14 ENCOUNTER — Other Ambulatory Visit: Payer: Self-pay | Admitting: *Deleted

## 2018-08-14 DIAGNOSIS — E119 Type 2 diabetes mellitus without complications: Secondary | ICD-10-CM

## 2018-08-15 MED ORDER — GLIPIZIDE 5 MG PO TABS: 5.0000 mg | ORAL_TABLET | Freq: Every day | ORAL | 0 refills | Status: DC

## 2018-08-30 ENCOUNTER — Other Ambulatory Visit: Payer: Self-pay

## 2018-09-01 ENCOUNTER — Telehealth: Payer: Self-pay | Admitting: Family Medicine

## 2018-09-01 MED ORDER — METFORMIN HCL 1000 MG PO TABS: 1000.0000 mg | ORAL_TABLET | Freq: Two times a day (BID) | ORAL | 3 refills | Status: DC

## 2018-09-01 NOTE — Telephone Encounter (Signed)
**  After Hours/ Emergency Line Call**  Received a call to report that [name] would like a refill of his metformin. Patient has been out of medications since Tuesday and has been waiting for refill. States that he called [facility] today and his medication still has not been sent.  Endorsing no symptoms. Have sent in RX for metformin with 3 refills.  Red flags discussed.  Will forward to PCP.  Oralia Manis, DO PGY-2, [facility] 09/01/2018 7:45 PM

## 2018-11-01 ENCOUNTER — Ambulatory Visit: Payer: Self-pay | Admitting: Family Medicine

## 2018-11-06 ENCOUNTER — Other Ambulatory Visit: Payer: Self-pay | Admitting: Family Medicine

## 2018-11-06 DIAGNOSIS — E119 Type 2 diabetes mellitus without complications: Secondary | ICD-10-CM

## 2018-11-22 ENCOUNTER — Encounter: Payer: Self-pay | Admitting: Family Medicine

## 2018-11-22 ENCOUNTER — Ambulatory Visit (INDEPENDENT_AMBULATORY_CARE_PROVIDER_SITE_OTHER): Payer: Medicare Other | Admitting: Family Medicine

## 2018-11-22 ENCOUNTER — Other Ambulatory Visit: Payer: Self-pay

## 2018-11-22 VITALS — BP 122/72 | HR 98

## 2018-11-22 DIAGNOSIS — K6389 Other specified diseases of intestine: Secondary | ICD-10-CM | POA: Diagnosis not present

## 2018-11-22 DIAGNOSIS — E119 Type 2 diabetes mellitus without complications: Secondary | ICD-10-CM

## 2018-11-22 DIAGNOSIS — E785 Hyperlipidemia, unspecified: Secondary | ICD-10-CM

## 2018-11-22 DIAGNOSIS — Z23 Encounter for immunization: Secondary | ICD-10-CM | POA: Diagnosis not present

## 2018-11-22 DIAGNOSIS — E1169 Type 2 diabetes mellitus with other specified complication: Secondary | ICD-10-CM

## 2018-11-22 DIAGNOSIS — R1031 Right lower quadrant pain: Secondary | ICD-10-CM | POA: Diagnosis not present

## 2018-11-22 DIAGNOSIS — Z0000 Encounter for general adult medical examination without abnormal findings: Secondary | ICD-10-CM

## 2018-11-22 DIAGNOSIS — I10 Essential (primary) hypertension: Secondary | ICD-10-CM | POA: Diagnosis not present

## 2018-11-22 DIAGNOSIS — G8929 Other chronic pain: Secondary | ICD-10-CM

## 2018-11-22 LAB — POCT GLYCOSYLATED HEMOGLOBIN (HGB A1C): HbA1c, POC (controlled diabetic range): 7.5 % — AB (ref 0.0–7.0)

## 2018-11-22 NOTE — Progress Notes (Signed)
Subjective:   Patient ID: Calvin Newton    DOB: 1950-10-15, 68 y.o. male   MRN: [id number]  Calvin Newton is a 68 y.o. male with a history of HTN, T2DM, HDL,  here for T2DM follow up.  T2DM: A1C 7.5 today, up from 6.9 in April 2019. Currently takes Glipizide 5mg  QD and metformin 1000mg  BID. Notes compliance.. Notes CBGs have been in the 170-200's. He notes he had gained some weight when he was working a lot with little sleep. He recently retired so now he has started working out again. He is using bicycle exercises daily for 1 hour plus strength training. Denies any numbness/tingling in lower extremities.  Denies any polyuria, polydipsia, vision changes, or hypoglycemic episodes  Intestinal Mass:  Patient has chronic RLQ pain that is "annoying". Had CT scan in August 2019 notable for "Prominent soft tissue at ascending colon question prominent ileocecal valve versus mass". Had Colonoscopy November 2019, that noted lipomatous ileocecal valve and diffuse diverticulosis, otherwise unremarkable. Pain was thought to be MSK in origin. He notes pain is worse with deep palpation and improves with ibuprofen.  Uses Ibuprofen 800mg  BID which helps a little bit. He has also noticed some pain improvement starting to exercise   HTN: BP today 122/72. Denies any chest pain, SOB, vision changes.  HLD associated T2DM: Last lipid panel (09/2017): Chol 159, HDL 47, LDL 59, Trig 267. Not currently on any medications.  Health Maintenance: Due for diabetic eye exam, foot exam, and PNA-23 vaccine  Review of Systems:  Per HPI.   PMFSH, medications and smoking status reviewed.  Objective:   BP 122/72   Pulse 98   SpO2 97%  Vitals and nursing note reviewed.  General: pleasant older man, NAD, sitting comfortably in exam chair  CV: regular rate and rhythm without murmurs, rubs, or gallops, no lower extremity edema Lungs: clear to auscultation bilaterally with normal work of breathing Abdomen: soft,  non-tender, non-distended, normoactive bowel sounds Skin: warm, dry Extremities: warm and well perfused MSK: gait normal Neuro: Alert and oriented, speech normal Diabetic foot exam: 2+ DP pulses bilat, slightly decreased sensation at right toes, otherwise normal monofilament testing bilaterally. No lesions or significant calluses.   Assessment & Plan:   Diabetes type 2, controlled (HCC) Recent worsening control: 6.9>7.5. Currently on Glipizide 5mg  QD and Metformin 1000mg  BID, notes compliance.  Patient has started exercising daily  - Considered increasing to 5mg  BID, however has history of hypoglycemia at that dose. Considered SGLT-2 inhibitor/GLP-1 but unlikely will be covered by insurance.  - Continue Glipizide and Metformin as above. Will recheck A1C in 3 months after lifestyle modifications, then consider increase to BID at that time - Lifestyle Mods - continue daily exercise and diet modifications - ordered BMP, lipid, Microalbumin ratio - received PNA-23 vaccine - Currently on ASA 81 mg, never on statin. ASCVD >10% based on lipid panel in April 2019. Will defer until new lipid panel returns. Likely will need to start high intensity statin.   - patient to follow up with eye doctor for yearly exam - RTC in 3 months for repeat A1C   Abdominal pain, chronic, right lower quadrant No change in RUQ pain. Endorses improvement since starting to exercise. Some improvement with Ibuprofen 800mg  BID. CT abdomen/pelvis and colonoscopy reassuring. Recommend patient RTC if worsens for further evaluation.   Essential hypertension Normotensive today. Well-controlled with exercise and diet control alone.  - continue to monitor - BMP today  Hyperlipidemia associated with type  2 diabetes mellitus (HCC) Not currently on a statin Follow up lipid panel.  Intestinal mass Discussed colonoscopy results. Reassurance provided.  Healthcare maintenance - PNA-23 given today - diabetic foot exam  -  Patient to follow up with ophtho for diabetic eye exam  Orders Placed This Encounter  Procedures  . Pneumococcal polysaccharide vaccine 23-valent greater than or equal to 2yo subcutaneous/IM  . Basic Metabolic Panel  . Microalbumin/Creatinine Ratio, Urine  . Lipid Panel  . HgB A1c   Orpah Cobb, DO PGY-1, [facility] 11/22/2018 10:16 PM

## 2018-11-22 NOTE — Assessment & Plan Note (Addendum)
Recent worsening control: 6.9>7.5. Currently on Glipizide 5mg  QD and Metformin 1000mg  BID, notes compliance.  Patient has started exercising daily  - Considered increasing to 5mg  BID, however has history of hypoglycemia at that dose. Considered SGLT-2 inhibitor/GLP-1 but unlikely will be covered by insurance.  - Continue Glipizide and Metformin as above. Will recheck A1C in 3 months after lifestyle modifications, then consider increase to BID at that time - Lifestyle Mods - continue daily exercise and diet modifications - ordered BMP, lipid, Microalbumin ratio - received PNA-23 vaccine - Currently on ASA 81 mg, never on statin. ASCVD >10% based on lipid panel in April 2019. Will defer until new lipid panel returns. Likely will need to start high intensity statin.   - patient to follow up with eye doctor for yearly exam - RTC in 3 months for repeat A1C

## 2018-11-22 NOTE — Assessment & Plan Note (Signed)
Discussed colonoscopy results. Reassurance provided.

## 2018-11-22 NOTE — Assessment & Plan Note (Signed)
Normotensive today. Well-controlled with exercise and diet control alone.  - continue to monitor - BMP today

## 2018-11-22 NOTE — Assessment & Plan Note (Addendum)
No change in RUQ pain. Endorses improvement since starting to exercise. Some improvement with Ibuprofen 800mg  BID. CT abdomen/pelvis and colonoscopy reassuring. Recommend patient RTC if worsens for further evaluation.

## 2018-11-22 NOTE — Assessment & Plan Note (Signed)
Not currently on a statin Follow up lipid panel.

## 2018-11-22 NOTE — Assessment & Plan Note (Signed)
-   PNA-23 given today - diabetic foot exam  - Patient to follow up with ophtho for diabetic eye exam

## 2018-11-23 DIAGNOSIS — E119 Type 2 diabetes mellitus without complications: Secondary | ICD-10-CM | POA: Diagnosis not present

## 2018-11-23 LAB — BASIC METABOLIC PANEL
BUN/Creatinine Ratio: 11 (ref 10–24)
BUN: 12 mg/dL (ref 8–27)
CO2: 20 mmol/L (ref 20–29)
Calcium: 9.6 mg/dL (ref 8.6–10.2)
Chloride: 101 mmol/L (ref 96–106)
Creatinine, Ser: 1.1 mg/dL (ref 0.76–1.27)
GFR calc Af Amer: 80 mL/min/{1.73_m2} (ref >59)
GFR calc non Af Amer: 70 mL/min/{1.73_m2} (ref >59)
Glucose: 145 mg/dL — ABNORMAL HIGH (ref 65–99)
Potassium: 4.6 mmol/L (ref 3.5–5.2)
Sodium: 135 mmol/L (ref 134–144)

## 2018-11-23 LAB — LIPID PANEL
Chol/HDL Ratio: 5 ratio (ref 0.0–5.0)
Cholesterol, Total: 186 mg/dL (ref 100–199)
HDL: 37 mg/dL — ABNORMAL LOW (ref >39)
Triglycerides: 544 mg/dL — ABNORMAL HIGH (ref 0–149)

## 2018-11-25 LAB — MICROALBUMIN / CREATININE URINE RATIO
Creatinine, Urine: 160.5 mg/dL
Microalb/Creat Ratio: 73 mg/g creat — ABNORMAL HIGH (ref 0–29)
Microalbumin, Urine: 116.8 ug/mL

## 2018-11-28 ENCOUNTER — Telehealth: Payer: Self-pay | Admitting: *Deleted

## 2018-11-28 DIAGNOSIS — E119 Type 2 diabetes mellitus without complications: Secondary | ICD-10-CM

## 2018-11-28 DIAGNOSIS — I10 Essential (primary) hypertension: Secondary | ICD-10-CM

## 2018-11-28 MED ORDER — LISINOPRIL 2.5 MG PO TABS: 2.5000 mg | ORAL_TABLET | Freq: Every day | ORAL | 3 refills | Status: DC

## 2018-11-28 NOTE — Telephone Encounter (Signed)
Spoke to patient and discussed results of his most recent lab work.  Discussed starting a statin for his cholesterol.  Given that patient has started working out more he opted to wait on cholesterol medicine at this time and attempt lifestyle modifications.  Recommended low-dose lisinopril for kidney protection due to elevated microalbumin/creatinine ratio.  Patient was amendable to this.  Lisinopril 2.5 mg daily sent to pharmacy.  Patient to RTC in 2 to 3 months for checkup.

## 2018-11-28 NOTE — Telephone Encounter (Signed)
Pt wants to know the results of his urine test. Christen Bame, CMA

## 2018-11-30 DIAGNOSIS — E119 Type 2 diabetes mellitus without complications: Secondary | ICD-10-CM | POA: Diagnosis not present

## 2018-12-28 ENCOUNTER — Other Ambulatory Visit: Payer: Self-pay | Admitting: Family Medicine

## 2018-12-28 DIAGNOSIS — E119 Type 2 diabetes mellitus without complications: Secondary | ICD-10-CM

## 2019-01-03 ENCOUNTER — Telehealth: Payer: Self-pay | Admitting: *Deleted

## 2019-01-03 NOTE — Telephone Encounter (Signed)
Pt calls because he feels like since starting metformin in March that his sugars have steadily increased. He wonders if it has something to do with the recall Christen Bame, CMA

## 2019-01-04 NOTE — Telephone Encounter (Signed)
Spoke to patient. Reassured patient that as far as I know, Metformin should not be causing a rise in blood sugar. He reports he is not taking the recalled Metformin, but has been given a safe prescription. Patient also endorses taking Glipizide BID without any episodes of hypoglycemia. He notes his BS this AM was 154 and after he exercises will be in the 130's. Recommended patient continue Metformin 1000mg  BID and Glipizide 5mg  BID and RTC in Sept for 3 month A1C check. I also informed him I would let him know if I hear any new information about Metformin. Patient also notes he went to see his eye doctor in June and will have them fax over the results to the clinic. Patient to call clinic to make follow up appt for mid September.

## 2019-01-22 ENCOUNTER — Ambulatory Visit: Payer: Medicare Other | Admitting: Podiatry

## 2019-01-22 ENCOUNTER — Other Ambulatory Visit: Payer: Self-pay

## 2019-01-22 ENCOUNTER — Encounter: Payer: Self-pay | Admitting: Podiatry

## 2019-01-22 DIAGNOSIS — M79675 Pain in left toe(s): Secondary | ICD-10-CM | POA: Diagnosis not present

## 2019-01-22 DIAGNOSIS — E1142 Type 2 diabetes mellitus with diabetic polyneuropathy: Secondary | ICD-10-CM | POA: Diagnosis not present

## 2019-01-22 DIAGNOSIS — E1140 Type 2 diabetes mellitus with diabetic neuropathy, unspecified: Secondary | ICD-10-CM | POA: Insufficient documentation

## 2019-01-22 DIAGNOSIS — B351 Tinea unguium: Secondary | ICD-10-CM

## 2019-01-22 DIAGNOSIS — M79674 Pain in right toe(s): Secondary | ICD-10-CM | POA: Diagnosis not present

## 2019-01-22 NOTE — Progress Notes (Signed)
This patient presents to the office with chief complaint of long thick nails and diabetic feet.  This patient  says there  is  no pain and discomfort in their feet.  This patient says there are long thick painful nails.  These nails are painful walking and wearing shoes.  Patient has no history of infection or drainage from both feet.  Patient is unable to  self treat his own nails . This patient presents  to the office today for treatment of the  long nails and a foot evaluation due to history of  diabetes.  General Appearance  Alert, conversant and in no acute stress.  Vascular  Dorsalis pedis and posterior tibial  pulses are palpable  bilaterally.  Capillary return is within normal limits  bilaterally. Temperature is within normal limits  bilaterally.  Neurologic  Senn-Weinstein monofilament wire test normal left foot and diminished right foot.  bilaterally. Muscle power within normal limits bilaterally.  Nails Thick disfigured discolored nails with subungual debris  from hallux to fifth toes bilaterally. No evidence of bacterial infection or drainage bilaterally.  Orthopedic  No limitations of motion of motion feet .  No crepitus or effusions noted.  No bony pathology or digital deformities noted.  Skin  normotropic skin with no porokeratosis noted bilaterally.  No signs of infections or ulcers noted.     Onychomycosis  Diabetes with neuropathy  IE  Debride nails x 10.  A diabetic foot exam was performed and there is no evidence of any vascular or neurologic pathology.   RTC 10 weeks.   Gardiner Barefoot DPM

## 2019-01-28 ENCOUNTER — Other Ambulatory Visit: Payer: Self-pay | Admitting: Family Medicine

## 2019-01-28 DIAGNOSIS — E119 Type 2 diabetes mellitus without complications: Secondary | ICD-10-CM

## 2019-02-01 ENCOUNTER — Other Ambulatory Visit: Payer: Self-pay

## 2019-02-01 ENCOUNTER — Telehealth (INDEPENDENT_AMBULATORY_CARE_PROVIDER_SITE_OTHER): Payer: Medicare Other | Admitting: Family Medicine

## 2019-02-01 ENCOUNTER — Other Ambulatory Visit: Payer: Self-pay | Admitting: *Deleted

## 2019-02-01 DIAGNOSIS — R6889 Other general symptoms and signs: Secondary | ICD-10-CM | POA: Diagnosis not present

## 2019-02-01 DIAGNOSIS — U071 COVID-19: Secondary | ICD-10-CM | POA: Insufficient documentation

## 2019-02-01 DIAGNOSIS — Z20828 Contact with and (suspected) exposure to other viral communicable diseases: Secondary | ICD-10-CM

## 2019-02-01 DIAGNOSIS — Z20822 Contact with and (suspected) exposure to covid-19: Secondary | ICD-10-CM

## 2019-02-01 NOTE — Assessment & Plan Note (Signed)
No red flags symptoms, no difficulty breathing or shortness of breath. - Go to testing center at [facility]

## 2019-02-01 NOTE — Progress Notes (Signed)
[place] Telemedicine Visit  Patient consented to have virtual visit. Method of visit: Telephone  Encounter participants: Patient: Calvin Newton - located at home in [place] Provider: Nuala Alpha - located at [facility] Others (if applicable): none  Chief Complaint: COVID exposure  HPI:  Patient sister was told she was exposed to COVID-19 and she then found out after she had been around Calvin Newton. He has symptoms of URI but states "it feels like a little cold that I get every year". He denies new shortness of breath, dyspnea, difficulty breathing or chest pain. He has some mild congestion and non-productive cough but otherwise feels well.  ROS: per HPI  Pertinent PMHx: HTN, T2DM, HLD  Exam:  Respiratory: NWOB, speaking in full sentences  Assessment/Plan:  Exposure to Covid-19 Virus No red flags symptoms, no difficulty breathing or shortness of breath. - Go to testing center at [facility]    Time spent during visit with patient: >10 minutes  Calvin Newton, [place], PGY-3

## 2019-02-02 LAB — NOVEL CORONAVIRUS, NAA: SARS-CoV-2, NAA: DETECTED — AB

## 2019-02-04 ENCOUNTER — Other Ambulatory Visit: Payer: Self-pay

## 2019-02-04 ENCOUNTER — Telehealth (INDEPENDENT_AMBULATORY_CARE_PROVIDER_SITE_OTHER): Payer: Medicare Other | Admitting: Family Medicine

## 2019-02-04 DIAGNOSIS — J988 Other specified respiratory disorders: Secondary | ICD-10-CM

## 2019-02-04 DIAGNOSIS — U071 COVID-19: Secondary | ICD-10-CM

## 2019-02-04 NOTE — Progress Notes (Addendum)
[place] Telemedicine Visit  Patient consented to have virtual visit. Method of visit: Telephone  Encounter participants: Patient: Calvin Newton - located at home Provider: Rory Percy - located at [facility] Others (if applicable): n/a  Chief Complaint: COVID questions  HPI:  Patient tested positive for COVID 02/01/2019.  He has some questions regarding symptomatic management of his infection.  He has heard about Mucinex and wants to know if this will be helpful for him.  He is having some congestion and is sometimes coughing with deep breathing.  His cough is slightly productive but he feels like there is phlegm still down his lungs that he is not able to get up.  He denies any difficulties breathing, nausea, vomiting, diarrhea, sore throat, fevers.  He also wants to know if and when he can return to exercise.  He normally does about 50 minutes of cardiovascular exercise in addition to push-ups and other strength training every day with a total of about an hour and a half of exercise every day.  ROS: per HPI  Pertinent PMHx: HTN, DM2, HLD  Exam:  Respiratory: Speaks in full sentences, no respiratory distress  Assessment/Plan:  COVID-19 COVID + 02/01/2019.  Doing well from a symptomatic standpoint although is having some chest congestion with minimally productive cough.  Advised patient that he can take Mucinex for symptomatic relief.  Advised patient he also should not exercise during this acutely sick period as his body continues to heal.  Advised patient there is an exercise prescription that we can explore once he has fully recovered from [place].  Red flags reviewed with patient and reasons to present to the ED.  Patient verbalized understanding.   Time spent during visit with patient: 9 minutes

## 2019-02-04 NOTE — Assessment & Plan Note (Signed)
COVID + 02/01/2019.  Doing well from a symptomatic standpoint although is having some chest congestion with minimally productive cough.  Advised patient that he can take Mucinex for symptomatic relief.  Advised patient he also should not exercise during this acutely sick period as his body continues to heal.  Advised patient there is an exercise prescription that we can explore once he has fully recovered from [place].  Red flags reviewed with patient and reasons to present to the ED.  Patient verbalized understanding.

## 2019-02-12 ENCOUNTER — Other Ambulatory Visit: Payer: Self-pay

## 2019-02-12 ENCOUNTER — Telehealth (INDEPENDENT_AMBULATORY_CARE_PROVIDER_SITE_OTHER): Payer: Medicare Other | Admitting: Family Medicine

## 2019-02-12 DIAGNOSIS — J988 Other specified respiratory disorders: Secondary | ICD-10-CM | POA: Diagnosis not present

## 2019-02-12 DIAGNOSIS — U071 COVID-19: Secondary | ICD-10-CM

## 2019-02-12 NOTE — Progress Notes (Signed)
Virtual Visit via Telephone Note  I connected with Calvin Newton on 02/12/19 at  9:50 AM EDT by telephone and verified that I am speaking with the correct person using two identifiers.  Location: Patient: at home Provider: [facility] clinic   I discussed the limitations, risks, security and privacy concerns of performing an evaluation and management service by telephone and the availability of in person appointments. I also discussed with the patient that there may be a patient responsible charge related to this service. The patient expressed understanding and agreed to proceed.   History of Present Illness: Patient calls in who is recently found to be coronavirus positive on 21 August.  He said that he has had consistent coughing and congestion since then.  No longer with subjective fevers but does feel general muscle the muscle aches and shortness of breath.  Says that it takes him a little bit each morning to "get his wind going "but he is generally able to get around the house and do his activities once he makes himself get up and get moving.  Patient is prior known to be rather healthy and does over an hour of cardio per day.  He is frustrated that he has been unable to have the energy to do his exercises.  He says that he is not been passing out, has been able to sustain himself with food and drink, no vomiting, is able to walk around, he does not have objective oxygen testing but feels that he is not in a crisis requiring going to the emergency department.  Wants to know if there is anything we can do.   Observations/Objective: Patient speaking in full voices and does have an occasional cough but appears to be alert and in no apparent distress.  Assessment and Plan: Discussed emergency return precautions with this patient who seems to be ill but not in a crisis requiring emergency department visit.  We discussed again that he is coronavirus positive and should be still self isolating which  patient agrees with.  He said that he feels comfortable to make the decision to go the emergency department if things get worse.  Follow Up Instructions: Let us know if you need anything.   I discussed the assessment and treatment plan with the patient. The patient was provided an opportunity to ask questions and all were answered. The patient agreed with the plan and demonstrated an understanding of the instructions.   The patient was advised to call back or seek an in-person evaluation if the symptoms worsen or if the condition fails to improve as anticipated.  I provided 15 minutes of non-face-to-face time during this encounter.   Sherene Sires, DO

## 2019-02-15 ENCOUNTER — Other Ambulatory Visit: Payer: Self-pay

## 2019-02-15 ENCOUNTER — Telehealth (INDEPENDENT_AMBULATORY_CARE_PROVIDER_SITE_OTHER): Payer: Medicare Other | Admitting: Family Medicine

## 2019-02-15 DIAGNOSIS — J988 Other specified respiratory disorders: Secondary | ICD-10-CM

## 2019-02-15 DIAGNOSIS — U071 COVID-19: Secondary | ICD-10-CM

## 2019-02-15 NOTE — Progress Notes (Signed)
[facility] Telemedicine Visit  Patient consented to have virtual visit. Method of visit: Telephone  Encounter participants: Patient: Calvin Newton - located at home Provider: Rory Percy - located at [facility] Others (if applicable): n/a  Chief Complaint: COVID questions  HPI:  Tested positive for COVID 02/01/2019. Patient calls today as he has general questions surrounding COVID. He asks if there is any medications approved to take for COVID.   He is continuing to do well, afebrile without struggling to breathe. He does get slightly SOB with walking but denies difficulties breathing. He has lingering nonproductive cough and reports his chest feels heavy. Has been taking mucinex. Denies N/V/D. Reports highest CBG 220. He has been staying at home and self-isolating.   ROS: per HPI  Pertinent PMHx: HTN, DM2  Exam:  Respiratory: Speaking in full sentences, no respiratory distress.  Occasional coughing.  Assessment/Plan:  COVID-19 Patient continues to do well without respiratory distress.  Does continue to have some nonproductive coughing with subjective chest heaviness, recommended continual use of Mucinex.  Answered questions to patient's satisfaction, explained there is currently no medication indicated for outpatient treatment of COVID-19.  Emergency precautions discussed and reasons to present to the ED.  Again encouraged patient to remain at home self isolating and practice adequate hand hygiene.  Would likely benefit from diabetes follow-up with PCP once over acute illness.   Time spent during visit with patient: 9 minutes

## 2019-02-15 NOTE — Assessment & Plan Note (Signed)
Patient continues to do well without respiratory distress.  Does continue to have some nonproductive coughing with subjective chest heaviness, recommended continual use of Mucinex.  Answered questions to patient's satisfaction, explained there is currently no medication indicated for outpatient treatment of COVID-19.  Emergency precautions discussed and reasons to present to the ED.  Again encouraged patient to remain at home self isolating and practice adequate hand hygiene.  Would likely benefit from diabetes follow-up with PCP once over acute illness.

## 2019-02-15 NOTE — Progress Notes (Signed)
225# bp- n/a  t- no fever  [place] ([place]), [place] - [address]  Pt gave consent to telephone visit. Salvatore Marvel, CMA

## 2019-02-26 ENCOUNTER — Telehealth: Payer: Self-pay | Admitting: *Deleted

## 2019-02-26 NOTE — Telephone Encounter (Signed)
Pt wants to know when he can come in for a DM visit, since he recently tested positive. Christen Bame, CMA

## 2019-02-27 NOTE — Telephone Encounter (Addendum)
Called patient with message of scheduling appointment for diabetes in October.    Patient states that his quarantine restrictions were lifted on 02/12/2019.  Patient says that he is not having any symptoms but wants to know if he will need to be retested.  Patient also states that his blood sugars are always high in the AM/  Scheduled appointment for 03/20/2019.  Ozella Almond, [place]

## 2019-02-27 NOTE — Telephone Encounter (Signed)
Inform him he can schedule a follow DM visit for sometime in October, that way he has time to fully recover and get through quarantine restrictions. Thank you!

## 2019-03-20 ENCOUNTER — Other Ambulatory Visit: Payer: Self-pay | Admitting: Family Medicine

## 2019-03-20 ENCOUNTER — Other Ambulatory Visit: Payer: Self-pay

## 2019-03-20 ENCOUNTER — Ambulatory Visit (INDEPENDENT_AMBULATORY_CARE_PROVIDER_SITE_OTHER): Payer: Medicare Other | Admitting: Family Medicine

## 2019-03-20 ENCOUNTER — Encounter: Payer: Self-pay | Admitting: Family Medicine

## 2019-03-20 VITALS — BP 116/72 | HR 113 | Wt 266.8 lb

## 2019-03-20 DIAGNOSIS — Z23 Encounter for immunization: Secondary | ICD-10-CM | POA: Diagnosis not present

## 2019-03-20 DIAGNOSIS — E1169 Type 2 diabetes mellitus with other specified complication: Secondary | ICD-10-CM

## 2019-03-20 DIAGNOSIS — E119 Type 2 diabetes mellitus without complications: Secondary | ICD-10-CM

## 2019-03-20 DIAGNOSIS — I10 Essential (primary) hypertension: Secondary | ICD-10-CM

## 2019-03-20 DIAGNOSIS — E785 Hyperlipidemia, unspecified: Secondary | ICD-10-CM

## 2019-03-20 LAB — POCT GLYCOSYLATED HEMOGLOBIN (HGB A1C): HbA1c, POC (controlled diabetic range): 7.1 % — AB (ref 0.0–7.0)

## 2019-03-20 LAB — GLUCOSE, POCT (MANUAL RESULT ENTRY): POC Glucose: 139 mg/dl — AB (ref 70–99)

## 2019-03-20 MED ORDER — ATORVASTATIN CALCIUM 40 MG PO TABS: 40.0000 mg | ORAL_TABLET | Freq: Every day | ORAL | 2 refills | Status: DC

## 2019-03-20 NOTE — Patient Instructions (Signed)
Thank you so much for coming to see me today. Your A1C is improved since June, down from 7.5 to 7.1. That is fantastic! Keep up the great work. Please continue to exercise like you have been. It seems to be doing the trick.  Lets plan to see each other in 6 months for your annual physical and diabetes follow up.   Take care, Dr. Tarry Kos

## 2019-03-20 NOTE — Progress Notes (Signed)
Subjective:   Patient ID: Calvin Newton    DOB: 1950/11/09, 68 y.o. male   MRN: [id number]  Calvin Newton is a 68 y.o. male with a history of HTN, T2DM with peripheral neuropathy, HLD here for diabetes follow up.  T2DM: Last A1C 7.5, A1C today 7.1. Currently taking Glipizide 5mg  BID and Metformin 1000mg  BID. Endorses compliance. CBG's range 150-185. CBG today 139. Exercises 4-5x/week on bike. Patient has lost 2lbs since last year. He notes he has lost more but gained some of it back after getting COVID. Denies any hypoglycemic episodes. Denies any polyuria, polydipsia, polyphagia. He notes he had his diabetic eye exam in June 2020. He notes that other than developing cataracts, the exam was normal.   HTN: BP 116/72. Currently on Lisinopril 2.5mg  QD for kidney protection. Denies any side effects. Endorses compliance. Denies any chest pain, SOB, headaches, or vision changes.  HLD: Last lipid panel elevated with Trig of 544, HDL 37, Total chol 186. Currently not on any medications. Discussed during last visit about starting statin but patient declined at that time as he wanted to try lifestyle modifications.   Health Maintenance: Due for flu shot.  Review of Systems:  Per HPI.   [place], medications and smoking status reviewed.  Objective:   BP 116/72   Pulse (!) 113   Wt 266 lb 12.8 oz (121 kg)   SpO2 96%   BMI 36.69 kg/m  Vitals and nursing note reviewed.  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 Currently on Lisinopril for kidney protection. BMP with stable kidney function.  - continue to monitor - plan for CMP at next visit to evaluate kidney and liver function  Hyperlipidemia associated with type 2 diabetes mellitus ([place]) Last lipid panel elevated. Patient open to starting Statin. ASCVD 10-year risk is 23.7% with recommendation for high intensity statin therapy.  - Start Atorvastatin 40mg  QD.  - plan for lipid panel at next visit  Diabetes type 2, controlled ([place]) A1C improved from 7.5 to 7.1. Currently on Glipizide 5mg  BID and Metformin 1000mg  BID. Blood sugars appear to be well controlled. Patient currently exercising, however endorsed reduction in exercise routine due to recent covid infection. Patient has lost 2lb since last visit.  - continue current medications - Encouraged continuing lifestyle modifications including daily exercise and diet - Currently on ACE and Statin  - Up to date on diabetic eye exam, foot exam, and PNA vaccine - RTC 6 months for follow up - plan for CMP to evaluate kidney and liver function  Health Maintenance: - Flu shot given today  Orders Placed This Encounter  Procedures  . Flu Vaccine QUAD 36+ mos IM  . Basic Metabolic Panel  . HgB A1c  . Glucose (CBG)   Meds ordered this encounter  Medications  . atorvastatin (LIPITOR) 40 MG tablet    Sig: Take 1 tablet (40 mg total) by mouth daily.    Dispense:  90 tablet    Refill:  2  Orpah Cobb, DO PGY-2, [facility] 03/21/2019 10:30 PM

## 2019-03-21 ENCOUNTER — Encounter: Payer: Self-pay | Admitting: Family Medicine

## 2019-03-21 LAB — BASIC METABOLIC PANEL
BUN/Creatinine Ratio: 15 (ref 10–24)
BUN: 13 mg/dL (ref 8–27)
CO2: 20 mmol/L (ref 20–29)
Calcium: 9.3 mg/dL (ref 8.6–10.2)
Chloride: 101 mmol/L (ref 96–106)
Creatinine, Ser: 0.89 mg/dL (ref 0.76–1.27)
GFR calc Af Amer: 103 mL/min/{1.73_m2} (ref >59)
GFR calc non Af Amer: 89 mL/min/{1.73_m2} (ref >59)
Glucose: 132 mg/dL — ABNORMAL HIGH (ref 65–99)
Potassium: 4.6 mmol/L (ref 3.5–5.2)
Sodium: 136 mmol/L (ref 134–144)

## 2019-03-21 NOTE — Assessment & Plan Note (Addendum)
A1C improved from 7.5 to 7.1. Currently on Glipizide 5mg  BID and Metformin 1000mg  BID. Blood sugars appear to be well controlled. Patient currently exercising, however endorsed reduction in exercise routine due to recent covid infection. Patient has lost 2lb since last visit.  - continue current medications - Encouraged continuing lifestyle modifications including daily exercise and diet - Currently on ACE and Statin  - Up to date on diabetic eye exam, foot exam, and PNA vaccine - RTC 6 months for follow up - plan for CMP to evaluate kidney and liver function

## 2019-03-21 NOTE — Assessment & Plan Note (Addendum)
Last lipid panel elevated. Patient open to starting Statin. ASCVD 10-year risk is 23.7% with recommendation for high intensity statin therapy.  - Start Atorvastatin 40mg  QD.  - plan for lipid panel at next visit

## 2019-03-21 NOTE — Assessment & Plan Note (Addendum)
Well controlled. Currently on Lisinopril for kidney protection. BMP with stable kidney function.  - continue to monitor - plan for CMP at next visit to evaluate kidney and liver function

## 2019-04-03 ENCOUNTER — Ambulatory Visit: Payer: Medicare Other | Admitting: Podiatry

## 2019-04-03 ENCOUNTER — Other Ambulatory Visit: Payer: Self-pay

## 2019-04-03 ENCOUNTER — Encounter: Payer: Self-pay | Admitting: Podiatry

## 2019-04-03 DIAGNOSIS — M79675 Pain in left toe(s): Secondary | ICD-10-CM | POA: Diagnosis not present

## 2019-04-03 DIAGNOSIS — E1142 Type 2 diabetes mellitus with diabetic polyneuropathy: Secondary | ICD-10-CM | POA: Diagnosis not present

## 2019-04-03 DIAGNOSIS — M79674 Pain in right toe(s): Secondary | ICD-10-CM

## 2019-04-03 DIAGNOSIS — B351 Tinea unguium: Secondary | ICD-10-CM

## 2019-04-03 NOTE — Progress Notes (Signed)
Complaint:  Visit Type: Patient returns to my office for continued preventative foot care services. Complaint: Patient states" my nails have grown long and thick and become painful to walk and wear shoes" Patient has been diagnosed with DM with no foot complications. The patient presents for preventative foot care services.  Podiatric Exam: Vascular: dorsalis pedis and posterior tibial pulses are palpable bilateral. Capillary return is immediate. Temperature gradient is WNL. Skin turgor WNL  Sensorium: Normal Semmes Weinstein monofilament test left foot with diminished LOPS right foot.. Normal tactile sensation bilaterally. Nail Exam: Pt has thick disfigured discolored nails with subungual debris noted bilateral entire nail hallux through fifth toenails Ulcer Exam: There is no evidence of ulcer or pre-ulcerative changes or infection. Orthopedic Exam: Muscle tone and strength are WNL. No limitations in general ROM. No crepitus or effusions noted. Foot type and digits show no abnormalities. Bony prominences are unremarkable. Skin: No Porokeratosis. No infection or ulcers  Diagnosis:  Onychomycosis, , Pain in right toe, pain in left toes  Treatment & Plan Procedures and Treatment: Consent by patient was obtained for treatment procedures.   Debridement of mycotic and hypertrophic toenails, 1 through 5 bilateral and clearing of subungual debris. No ulceration, no infection noted.  Return Visit-Office Procedure: Patient instructed to return to the office for a follow up visit 10 weeks  for continued evaluation and treatment.    Zafar Debrosse DPM 

## 2019-06-18 ENCOUNTER — Ambulatory Visit: Payer: Medicare Other | Admitting: Podiatry

## 2019-06-18 ENCOUNTER — Other Ambulatory Visit: Payer: Self-pay

## 2019-06-18 ENCOUNTER — Encounter: Payer: Self-pay | Admitting: Podiatry

## 2019-06-18 DIAGNOSIS — M79674 Pain in right toe(s): Secondary | ICD-10-CM | POA: Diagnosis not present

## 2019-06-18 DIAGNOSIS — M79675 Pain in left toe(s): Secondary | ICD-10-CM | POA: Diagnosis not present

## 2019-06-18 DIAGNOSIS — E1142 Type 2 diabetes mellitus with diabetic polyneuropathy: Secondary | ICD-10-CM

## 2019-06-18 DIAGNOSIS — B351 Tinea unguium: Secondary | ICD-10-CM

## 2019-06-18 NOTE — Progress Notes (Signed)
Complaint:  Visit Type: Patient returns to my office for continued preventative foot care services. Complaint: Patient states" my nails have grown long and thick and become painful to walk and wear shoes" Patient has been diagnosed with DM with no foot complications. The patient presents for preventative foot care services.  Podiatric Exam: Vascular: dorsalis pedis and posterior tibial pulses are palpable bilateral. Capillary return is immediate. Temperature gradient is WNL. Skin turgor WNL  Sensorium: Normal Semmes Weinstein monofilament test left foot with diminished LOPS right foot.. Normal tactile sensation bilaterally. Nail Exam: Pt has thick disfigured discolored nails with subungual debris noted bilateral entire nail hallux through fifth toenails Ulcer Exam: There is no evidence of ulcer or pre-ulcerative changes or infection. Orthopedic Exam: Muscle tone and strength are WNL. No limitations in general ROM. No crepitus or effusions noted. Foot type and digits show no abnormalities. Bony prominences are unremarkable. Skin: No Porokeratosis. No infection or ulcers  Diagnosis:  Onychomycosis, , Pain in right toe, pain in left toes  Treatment & Plan Procedures and Treatment: Consent by patient was obtained for treatment procedures.   Debridement of mycotic and hypertrophic toenails, 1 through 5 bilateral and clearing of subungual debris. No ulceration, no infection noted.  Return Visit-Office Procedure: Patient instructed to return to the office for a follow up visit 10 weeks  for continued evaluation and treatment.    Helane Gunther DPM

## 2019-06-26 ENCOUNTER — Encounter: Payer: Self-pay | Admitting: Family Medicine

## 2019-06-26 ENCOUNTER — Other Ambulatory Visit: Payer: Self-pay

## 2019-06-26 ENCOUNTER — Ambulatory Visit (INDEPENDENT_AMBULATORY_CARE_PROVIDER_SITE_OTHER): Payer: Medicare Other | Admitting: Family Medicine

## 2019-06-26 VITALS — BP 130/80 | HR 98 | Wt 280.8 lb

## 2019-06-26 DIAGNOSIS — I10 Essential (primary) hypertension: Secondary | ICD-10-CM | POA: Diagnosis not present

## 2019-06-26 DIAGNOSIS — E785 Hyperlipidemia, unspecified: Secondary | ICD-10-CM

## 2019-06-26 DIAGNOSIS — R3915 Urgency of urination: Secondary | ICD-10-CM

## 2019-06-26 DIAGNOSIS — E119 Type 2 diabetes mellitus without complications: Secondary | ICD-10-CM | POA: Diagnosis not present

## 2019-06-26 DIAGNOSIS — E1169 Type 2 diabetes mellitus with other specified complication: Secondary | ICD-10-CM

## 2019-06-26 DIAGNOSIS — N400 Enlarged prostate without lower urinary tract symptoms: Secondary | ICD-10-CM

## 2019-06-26 LAB — POCT URINALYSIS DIP (MANUAL ENTRY)
Bilirubin, UA: NEGATIVE
Blood, UA: NEGATIVE
Glucose, UA: NEGATIVE mg/dL
Ketones, POC UA: NEGATIVE mg/dL
Leukocytes, UA: NEGATIVE
Nitrite, UA: NEGATIVE
Protein Ur, POC: NEGATIVE mg/dL
Spec Grav, UA: 1.02 (ref 1.010–1.025)
Urobilinogen, UA: 0.2 E.U./dL
pH, UA: 7 (ref 5.0–8.0)

## 2019-06-26 LAB — POCT GLYCOSYLATED HEMOGLOBIN (HGB A1C): HbA1c, POC (controlled diabetic range): 7.6 % — AB (ref 0.0–7.0)

## 2019-06-26 NOTE — Patient Instructions (Addendum)
For your diabetes, please try to cut back on the fruit juices. I think that is contributing to your elevated A1C. Continue your Metformin and Glipizide. Lets cut back on the juices and plan to see each other again in 3 months (March 2021). Continue to work out like you're doing because that is tremendous for your heart, cholesterol, diabetes, and blood pressure.  Please continue your cholesterol medicine and blood pressure as prescribed.   For your urinary symptoms, I have collected a urine sample and other labs to further evaluate. If this continues I can place a referral to urology. Just let me know. I will contact you with the results.   Take care, Stay safe. Dr. Mauri Reading

## 2019-06-26 NOTE — Progress Notes (Signed)
Subjective:   Patient ID: Calvin Newton    DOB: 05-23-1951, 69 y.o. male   MRN: [id number]  Calvin Newton is a 69 y.o. male with a history of HTN, T2DM, HLD, chronic RLQ abdominal pain here for follow up.  Diabetes: Last A1C 7.1, A1C today 7.6. Currently on Glipizide 5mg  BID and Metformin 1000mg  BID. Endorses compliance. Wt 280lbs, up from 166lbs in October 2020. Denies any hypoglycemia symptoms. Denies any polyuria, polydipsia, polyphagia. He notes she has started working out again 2 months ago including cardio, weights and push ups, working out 5-6x/week. He does note he has been eating butter pecan on occasion and a half pint of crandberry juice and other juices to help with his kidneys.  HTN:  BP 130/80 today. Currently on Lisinopril 2.5mg  QD. Endorses compliance. Denies any chest pain, SOB, vision changes, or headaches.   HLD: Last lipid panel June 2020 notable for elevated Triglycerides to 544, HDL 37, unable to calculate LDL. Started on Atorvastatin 40mg  QD.  Endorses compliance. Denies any muscles aches or weakness.  Urinary Urgency: Patient notes he has been having urinary urgency. Denies dribbling, weak flow, increased frequency, dysuria, discharge, or hesitancy. He notes he has never had a prostate exam. Denies any fever or chills.  Review of Systems:  Per HPI.   [place], medications and smoking status reviewed.  Objective:   BP 130/80   Pulse 98   Wt 280 lb 12.8 oz (127.4 kg)   SpO2 96%   BMI 38.62 kg/m  Vitals and nursing note reviewed.  General: pleasant older AA male, well nourished, well developed, in no acute distress with non-toxic appearance, sitting comfortably in exam chair CV: regular rate and rhythm without murmurs, rubs, or gallops, trace LE edema bilaterally Lungs: clear to auscultation bilaterally with normal work of breathing Skin: warm, dry Extremities: warm and well perfused YHC:WCBJ normal Neuro: Alert and oriented, speech normal Prostate  Exam: Prostate enlarged on DRE, nontender to palpation Chaperone: Leonia Corona  Assessment & Plan:   Essential hypertension BP normotensive today. Currently on Lisinopril 2.5mg  QD for kidney protection. Will continue to monitor. Consider increase in Lisinopril if worsens.  Diabetes type 2, controlled ([place]) A1C worsened today to 7.6 despite exercising daily with cardio and strength training. Expect secondary to weight gain and calorically dense juices he is consuming daily. Discussed limiting juice intake to 1 glass a day or less and consider diluting with water to minimize excessive sugar intake. Patient was amendable to this change. Congratulated patient on his exercise regimen and recommended he continue this. Will plan to follow up in 3 months for repeat A1C (March 2021). Continue Metformin and Glipizide as prescribed.  Hyperlipidemia associated with type 2 diabetes mellitus ([place]) Repeat Lipid panel with improved triglycerides, HDL, and LDL cholesterol (Chol 117, HDL 46, Trig 212, LDL 38). Continue Atorvastatin 40mg  QD.  Enlarged prostate on rectal examination DRE notable for nontender prostate enlargement. PSA elevated to 9.8. Unclear if 2/2 to prior DRE vs BPH vs malignancy. Will obtain repeat PSA in 1 week. Lab appointment scheduled. If persistently elevated will consider urology appointment vs starting Flomax to help with urinary symptoms pending level.   Urinary urgency Patient complaining of urinary urgency without dysuria, dribbling, weak flow, increased frequency, or hesitancy. UA negative for infection. Ddx included urge incontinence vs prostate etiology such as BPH. PSA elevated and prostate enlarged but nontender on DRE. Will repeat PSA in 1 week to revaluate as stated above. Pending result will consider  referral to urology for further evaluation vs trial of Flomax. May need medication for true urge incontinence if no improvement with above.   Orders Placed This Encounter   Procedures  . Lipid Panel  . PSA, total and free  . PSA, total and free    Standing Status:   Future    Standing Expiration Date:   06/26/2020  . HgB A1c  . POCT urinalysis dipstick    Orpah Cobb, DO PGY-2, [facility] 06/27/2019 12:02 PM

## 2019-06-27 DIAGNOSIS — N400 Enlarged prostate without lower urinary tract symptoms: Secondary | ICD-10-CM

## 2019-06-27 DIAGNOSIS — R3915 Urgency of urination: Secondary | ICD-10-CM | POA: Insufficient documentation

## 2019-06-27 HISTORY — DX: Benign prostatic hyperplasia without lower urinary tract symptoms: N40.0

## 2019-06-27 LAB — LIPID PANEL
Chol/HDL Ratio: 2.5 ratio (ref 0.0–5.0)
Cholesterol, Total: 117 mg/dL (ref 100–199)
HDL: 46 mg/dL (ref >39)
LDL Chol Calc (NIH): 38 mg/dL (ref 0–99)
Triglycerides: 212 mg/dL — ABNORMAL HIGH (ref 0–149)
VLDL Cholesterol Cal: 33 mg/dL (ref 5–40)

## 2019-06-27 LAB — PSA, TOTAL AND FREE
PSA, Free Pct: 6 %
PSA, Free: 0.59 ng/mL
Prostate Specific Ag, Serum: 9.8 ng/mL — ABNORMAL HIGH (ref 0.0–4.0)

## 2019-06-27 NOTE — Assessment & Plan Note (Signed)
DRE notable for nontender prostate enlargement. PSA elevated to 9.8. Unclear if 2/2 to prior DRE vs BPH vs malignancy. Will obtain repeat PSA in 1 week. Lab appointment scheduled. If persistently elevated will consider urology appointment vs starting Flomax to help with urinary symptoms pending level.

## 2019-06-27 NOTE — Assessment & Plan Note (Signed)
Repeat Lipid panel with improved triglycerides, HDL, and LDL cholesterol (Chol 117, HDL 46, Trig 212, LDL 38). Continue Atorvastatin 40mg  QD.

## 2019-06-27 NOTE — Assessment & Plan Note (Signed)
A1C worsened today to 7.6 despite exercising daily with cardio and strength training. Expect secondary to weight gain and calorically dense juices he is consuming daily. Discussed limiting juice intake to 1 glass a day or less and consider diluting with water to minimize excessive sugar intake. Patient was amendable to this change. Congratulated patient on his exercise regimen and recommended he continue this. Will plan to follow up in 3 months for repeat A1C (March 2021). Continue Metformin and Glipizide as prescribed.

## 2019-06-27 NOTE — Assessment & Plan Note (Signed)
Patient complaining of urinary urgency without dysuria, dribbling, weak flow, increased frequency, or hesitancy. UA negative for infection. Ddx included urge incontinence vs prostate etiology such as BPH. PSA elevated and prostate enlarged but nontender on DRE. Will repeat PSA in 1 week to revaluate as stated above. Pending result will consider referral to urology for further evaluation vs trial of Flomax. May need medication for true urge incontinence if no improvement with above.

## 2019-06-27 NOTE — Assessment & Plan Note (Signed)
BP normotensive today. Currently on Lisinopril 2.5mg  QD for kidney protection. Will continue to monitor. Consider increase in Lisinopril if worsens.

## 2019-07-03 ENCOUNTER — Other Ambulatory Visit: Payer: Medicare Other

## 2019-07-03 ENCOUNTER — Other Ambulatory Visit: Payer: Self-pay

## 2019-07-03 DIAGNOSIS — N400 Enlarged prostate without lower urinary tract symptoms: Secondary | ICD-10-CM

## 2019-07-04 ENCOUNTER — Telehealth: Payer: Self-pay | Admitting: *Deleted

## 2019-07-04 ENCOUNTER — Other Ambulatory Visit: Payer: Self-pay | Admitting: Family Medicine

## 2019-07-04 DIAGNOSIS — N400 Enlarged prostate without lower urinary tract symptoms: Secondary | ICD-10-CM

## 2019-07-04 DIAGNOSIS — N401 Enlarged prostate with lower urinary tract symptoms: Secondary | ICD-10-CM

## 2019-07-04 DIAGNOSIS — R972 Elevated prostate specific antigen [PSA]: Secondary | ICD-10-CM

## 2019-07-04 LAB — PSA, TOTAL AND FREE
PSA, Free Pct: 6.3 %
PSA, Free: 0.56 ng/mL
Prostate Specific Ag, Serum: 8.9 ng/mL — ABNORMAL HIGH (ref 0.0–4.0)

## 2019-07-04 MED ORDER — TAMSULOSIN HCL 0.4 MG PO CAPS: 0.4000 mg | ORAL_CAPSULE | Freq: Every day | ORAL | 3 refills | Status: DC

## 2019-07-04 NOTE — Telephone Encounter (Signed)
Joana Reamer, DO  07/04/2019 10:35 AM EST    Attempted to contact patient with results without success. If patient calls back inform him that his prostate level was still high so I am going to refer him to urology for further evaluation so they can trend this appropriately. I am also going to start him on a medicine called Flomax that should help his urinary symptoms.      Pt called back and was informed of the above.  Jone Baseman, CMA

## 2019-07-04 NOTE — Progress Notes (Signed)
Attempted to contact patient with results without success. If patient calls back inform him that his prostate level was still high so I am going to refer him to urology for further evaluation so they can trend this appropriately. I am also going to start him on a medicine called Flomax that should help his urinary symptoms.

## 2019-07-04 NOTE — Progress Notes (Signed)
Elevated PSA and enlarged prostate on exam with associated urinary symptoms. Will refer to urology to evaluate elevated PSA and start Flomax 0.4mg  QD.

## 2019-07-29 ENCOUNTER — Other Ambulatory Visit: Payer: Self-pay | Admitting: Family Medicine

## 2019-07-29 DIAGNOSIS — E119 Type 2 diabetes mellitus without complications: Secondary | ICD-10-CM

## 2019-08-02 ENCOUNTER — Ambulatory Visit: Payer: Medicare Other | Attending: Internal Medicine

## 2019-08-02 DIAGNOSIS — Z23 Encounter for immunization: Secondary | ICD-10-CM | POA: Insufficient documentation

## 2019-08-02 NOTE — Progress Notes (Signed)
   Covid-19 Vaccination Clinic  Name:  RYSON BACHA    MRN: [id number] DOB: 03-18-1951  08/02/2019  Mr. Koren was observed post Covid-19 immunization for 15 minutes without incidence. He was provided with Vaccine Information Sheet and instruction to access the V-Safe system.   Mr. Tomasik was instructed to call 911 with any severe reactions post vaccine: Marland Kitchen Difficulty breathing  . Swelling of your face and throat  . A fast heartbeat  . A bad rash all over your body  . Dizziness and weakness    Immunizations Administered    Name Date Dose VIS Date Route   Pfizer COVID-19 Vaccine 08/02/2019  5:23 PM 0.3 mL 05/24/2019 Intramuscular   Manufacturer: [employer], [employer]   Lot: [id number]   NDC: [address]-9

## 2019-08-21 ENCOUNTER — Other Ambulatory Visit: Payer: Self-pay

## 2019-08-21 ENCOUNTER — Ambulatory Visit: Payer: Medicare Other | Admitting: Podiatry

## 2019-08-21 ENCOUNTER — Encounter: Payer: Self-pay | Admitting: Podiatry

## 2019-08-21 DIAGNOSIS — M79675 Pain in left toe(s): Secondary | ICD-10-CM | POA: Diagnosis not present

## 2019-08-21 DIAGNOSIS — M79674 Pain in right toe(s): Secondary | ICD-10-CM | POA: Diagnosis not present

## 2019-08-21 DIAGNOSIS — B351 Tinea unguium: Secondary | ICD-10-CM | POA: Diagnosis not present

## 2019-08-21 DIAGNOSIS — E1142 Type 2 diabetes mellitus with diabetic polyneuropathy: Secondary | ICD-10-CM

## 2019-08-21 NOTE — Progress Notes (Signed)
This patient returns to my office for at risk foot care.  This patient requires this care by a professional since this patient will be at risk due to having diabetes. This patient is unable to cut nails themselves since the patient cannot reach their nails.These nails are painful walking and wearing shoes.  This patient presents for at risk foot care today.  General Appearance  Alert, conversant and in no acute stress.  Vascular  Dorsalis pedis and posterior tibial  pulses are palpable  bilaterally.  Capillary return is within normal limits  bilaterally. Temperature is within normal limits  bilaterally.  Neurologic  Senn-Weinstein monofilament wire test within normal limits  LOPS right foot is diminished.. Muscle power within normal limits bilaterally.  Nails Thick disfigured discolored nails with subungual debris  from hallux to fifth toes bilaterally. No evidence of bacterial infection or drainage bilaterally.  Orthopedic  No limitations of motion  feet .  No crepitus or effusions noted.  No bony pathology or digital deformities noted. HAV  B/L.  Hammer toes  B/L.  Skin  normotropic skin with no porokeratosis noted bilaterally.  No signs of infections or ulcers noted.     Onychomycosis  Pain in right toes  Pain in left toes.  Consent was obtained for treatment procedures. Mechanical debridement if nails  1-5 bilaterally performed using a nail nipper.  Filed with dremel without incident.  No infection or ulcer.     Return office visit  10 weeks.        Told patient to return for periodic foot care and evaluation due to potential at risk complications.   Helane Gunther DPM

## 2019-08-23 ENCOUNTER — Ambulatory Visit (INDEPENDENT_AMBULATORY_CARE_PROVIDER_SITE_OTHER): Payer: Medicare Other | Admitting: Family Medicine

## 2019-08-23 ENCOUNTER — Encounter: Payer: Self-pay | Admitting: Family Medicine

## 2019-08-23 ENCOUNTER — Other Ambulatory Visit: Payer: Self-pay

## 2019-08-23 VITALS — BP 112/72 | HR 90 | Ht 71.5 in | Wt 280.4 lb

## 2019-08-23 DIAGNOSIS — I10 Essential (primary) hypertension: Secondary | ICD-10-CM

## 2019-08-23 DIAGNOSIS — N400 Enlarged prostate without lower urinary tract symptoms: Secondary | ICD-10-CM

## 2019-08-23 NOTE — Progress Notes (Signed)
   Subjective:   Patient ID: Calvin Newton    DOB: 1951-05-13, 69 y.o. male   MRN: [id number]  Calvin Newton is a 69 y.o. male with a history of essential hypertension, controlled type 2 diabetes, diabetic neuropathy, hyperlipidemia, chronic abdominal pain, enlarged prostate, urinary urgency here to discuss urology appointment.   Elevated PSA  Enlarged Prostate: Patient was noted to have elevated PSA and enlarged prostate. Patient notes he went to see the urologist. They are discussing doing a biopsy. He is currently on Flomax 0.4mg  QD. He notes a mild difference in his urinary symptoms. He only gets up 1x/night. He does notice occasional urge to go during the day but not often. He has follow up with urology in April.    HTN: Patient currently on Lisinopril 2.5mg  QD. He notes he developed an occasional tickle in his throat that he feels he need to clear his throat.   Review of Systems:  Per HPI.   PMFSH, medications and smoking status reviewed.  Objective:   BP 112/72   Pulse 90   Ht 5' 11.5" (1.816 m)   Wt 280 lb 6.4 oz (127.2 kg)   SpO2 96%   BMI 38.56 kg/m  Vitals and nursing note reviewed.  General: Pleasant older gentleman, sitting comfortably in exam chair, well nourished, well developed, in no acute distress with non-toxic appearance Resp: Breathing comfortably on room air, speaking in full sentences MSK:  gait normal Neuro: Alert and oriented, speech normal  Assessment & Plan:   Enlarged prostate on rectal examination Chronic. Uncertain prognosis. Patient here today to follow-up after his urology appointment.  He had several questions pertaining to appointment and future biopsy.  Answered all of the patient's questions appropriately.  He notes improvement in his urinary symptoms with Flomax.  He has follow-up with urologist on 4/29 for prostate biopsy.  Recommended patient call if he has any further questions.  He agreed to plan. - continue Flomax - follow up with  urology as scheduled - plan for TRUSP   Essential hypertension Normotensive.  Well controlled.  Currently on lisinopril 2.5 mg for kidney protection.  Endorses new symptoms of intermittent feeling of "scratchy throat".  Discussed possibility of adverse side effect from lisinopril.  Patient opted to continue lisinopril at this time and just monitor the symptoms for now.  Felt this was appropriate.  Plan to follow-up in 1 month for diabetes follow-up, we will plan to readdress at that time.  If continues, can transition to losartan.  Orpah Cobb, DO PGY-2, [facility] 08/25/2019 11:05 PM

## 2019-08-23 NOTE — Patient Instructions (Signed)
Thank you for coming to see me today. It was a pleasure to see you.   Please continue to follow up with your urologist as scheduled.   Continue your lisinopril daily. Let me know if the cough is bothersome and we can talk about transitioning.  Please follow-up with me in 1 month for diabetes follow up.  If you have any questions or concerns, please do not hesitate to call the office at [phone].  Take Care,   Dr. Orpah Cobb, DO Resident Physician [facility] [phone]

## 2019-08-25 NOTE — Assessment & Plan Note (Signed)
Normotensive.  Well controlled.  Currently on lisinopril 2.5 mg for kidney protection.  Endorses new symptoms of intermittent feeling of "scratchy throat".  Discussed possibility of adverse side effect from lisinopril.  Patient opted to continue lisinopril at this time and just monitor the symptoms for now.  Felt this was appropriate.  Plan to follow-up in 1 month for diabetes follow-up, we will plan to readdress at that time.  If continues, can transition to losartan.

## 2019-08-25 NOTE — Assessment & Plan Note (Addendum)
Chronic. Uncertain prognosis. Patient here today to follow-up after his urology appointment.  He had several questions pertaining to appointment and future biopsy.  Answered all of the patient's questions appropriately.  He notes improvement in his urinary symptoms with Flomax.  He has follow-up with urologist on 4/29 for prostate biopsy.  Recommended patient call if he has any further questions.  He agreed to plan. - continue Flomax - follow up with urology as scheduled - plan for TRUSP

## 2019-08-27 ENCOUNTER — Ambulatory Visit: Payer: Medicare Other | Attending: Internal Medicine

## 2019-08-27 DIAGNOSIS — Z23 Encounter for immunization: Secondary | ICD-10-CM

## 2019-08-27 NOTE — Progress Notes (Signed)
   Covid-19 Vaccination Clinic  Name:  Calvin Newton    MRN: [id number] DOB: 1951/03/11  08/27/2019  Calvin Newton was observed post Covid-19 immunization for 15 minutes without incident. He was provided with Vaccine Information Sheet and instruction to access the V-Safe system.   Calvin Newton was instructed to call 911 with any severe reactions post vaccine: Marland Kitchen Difficulty breathing  . Swelling of face and throat  . A fast heartbeat  . A bad rash all over body  . Dizziness and weakness   Immunizations Administered    Name Date Dose VIS Date Route   Pfizer COVID-19 Vaccine 08/27/2019  3:49 PM 0.3 mL 05/24/2019 Intramuscular   Manufacturer: [employer], [employer]   Lot: [id number]   NDC: [address]-7

## 2019-08-29 ENCOUNTER — Other Ambulatory Visit: Payer: Self-pay | Admitting: Family Medicine

## 2019-09-21 NOTE — Progress Notes (Signed)
Subjective:   Patient ID: Calvin Newton    DOB: 1951/01/18, 69 y.o. male   MRN: [id number]  Calvin Newton is a 69 y.o. male with a history of hypertension, type 2 diabetes, hyperlipidemia, diabetic neuropathy, osteoarthritis, chronic right lower quadrant abdominal pain, enlarge prostate followed by urology here for diabetes and blood pressure follow-up.  Diabetes: Last three A1C's below. Currently on glipizide 5 mg twice daily and Metformin 1000 mg twice daily. Endorses compliance. He was noted to be drinking lots of juices. He was also eating more ice-cream and chips. He was working two times a day which he felt compensated for his feeding choices. After our visit last time he realized that was not the case. He notes he has been cutting back on his sweet and continues to work out 5 days a week. Notes CBGs range 110-130, however fasting blood sugar 180's. He notes his last meal is around 6-7pm. Denies any hypoglycemia. Denies any polyuria, polydipsia, polyphagia.   Lab Results  Component Value Date   HGBA1C 8.0 (A) 09/24/2019   HGBA1C 7.6 (A) 06/26/2019   HGBA1C 7.1 (A) 03/20/2019    HTN:  BP: 138/80 today. Repeat 110/60. Currently on lisinopril 2.5 mg daily for kidney protection. Endorses compliance. Denies any chest pain, SOB, vision changes, or headaches.  Notes the tickle in his throat is not very aggravating.   HLD: Last lipid panel below. Currently on Atorvastatin 40mg  QD. Endorses compliance. Denies any muscles aches or weakness.  Lab Results  Component Value Date   CHOL 117 06/26/2019   HDL 46 06/26/2019   LDLCALC 38 06/26/2019   LDLDIRECT 105 (H) 08/18/2014   TRIG 212 (H) 06/26/2019   CHOLHDL 2.5 06/26/2019   Review of Systems:  Per HPI.   Objective:   BP 110/60   Pulse 90   Ht 5\' 11"  (1.803 m)   Wt 279 lb 12.8 oz (126.9 kg)   SpO2 96%   BMI 39.02 kg/m  Vitals and nursing note reviewed.  General: pleasant older male, sitting comfortably in exam chair,  well nourished, well developed, in no acute distress with non-toxic appearance CV: regular rate and rhythm without murmurs, rubs, or gallops Lungs: clear to auscultation bilaterally with normal work of breathing Skin: warm, dry Extremities: warm and well perfused MSK: gait normal Neuro: Alert and oriented, speech normal  Assessment & Plan:   Essential hypertension Blood pressure initially above goal, but much improved on repeat measuring.  Tolerating lisinopril well.  We will continue current medications as prescribed.  Type 2 diabetes mellitus with other specified complication (HCC) N8G worsened from 7.6 to 8.0 today. He is compliant with diabetes medications.  He is continuing to workout daily 5 days a week.  Applauded him for this success.  Weight is stable since last visit.  Currently on glipizide 5 mg twice daily and Metformin 1000 mg twice daily.  Discussed patient with Dr. Valentina Lucks who recommended an appointment with him to further evaluate other options such as SGLT2 or GLP-1 and review financial assistance options with patient's Medicare insurance.  Schedule patient for 4/26 with Dr. Valentina Lucks.  We will continue current medications as prescribed.  Can consider increasing glipizide to 10 mg twice daily however he has had history of hypoglycemia at this dose.  Will follow up with Dr. Valentina Lucks recommendations.  Patient to follow-up in 3 months for repeat A1c and further medication adjustments, sooner if complications.  Hyperlipidemia associated with type 2 diabetes mellitus (HCC) Last lipid panel  in 2021 with well-controlled LDL.  Does have elevated triglycerides.  Tolerating atorvastatin 40 mg daily.  Can consider addition of fibrate/niacin/fish oil in future.  Orders Placed This Encounter  Procedures  . Basic Metabolic Panel  . POCT glycosylated hemoglobin (Hb A1C)    Orpah Cobb, DO PGY-2, [facility] 09/29/2019 9:57 AM

## 2019-09-24 ENCOUNTER — Other Ambulatory Visit: Payer: Self-pay

## 2019-09-24 ENCOUNTER — Ambulatory Visit (INDEPENDENT_AMBULATORY_CARE_PROVIDER_SITE_OTHER): Payer: Medicare Other | Admitting: Family Medicine

## 2019-09-24 VITALS — BP 110/60 | HR 90 | Ht 71.0 in | Wt 279.8 lb

## 2019-09-24 DIAGNOSIS — E785 Hyperlipidemia, unspecified: Secondary | ICD-10-CM

## 2019-09-24 DIAGNOSIS — I10 Essential (primary) hypertension: Secondary | ICD-10-CM

## 2019-09-24 DIAGNOSIS — E119 Type 2 diabetes mellitus without complications: Secondary | ICD-10-CM | POA: Diagnosis not present

## 2019-09-24 DIAGNOSIS — E1169 Type 2 diabetes mellitus with other specified complication: Secondary | ICD-10-CM | POA: Diagnosis not present

## 2019-09-24 LAB — POCT GLYCOSYLATED HEMOGLOBIN (HGB A1C): HbA1c, POC (controlled diabetic range): 8 % — AB (ref 0.0–7.0)

## 2019-09-24 NOTE — Patient Instructions (Signed)
Thank you for coming to see me today. It was a pleasure to see you.   I am obtaining labs (A1C and kidney function). I will call you with the results.  Let us plan to meet in 3 months for follow up.  If you have any questions or concerns, please do not hesitate to call the office at [phone].  Take Care,   Dr. Orpah Cobb, DO Resident Physician [facility] [phone]

## 2019-09-25 LAB — BASIC METABOLIC PANEL
BUN/Creatinine Ratio: 11 (ref 10–24)
BUN: 11 mg/dL (ref 8–27)
CO2: 20 mmol/L (ref 20–29)
Calcium: 9.8 mg/dL (ref 8.6–10.2)
Chloride: 102 mmol/L (ref 96–106)
Creatinine, Ser: 1.02 mg/dL (ref 0.76–1.27)
GFR calc Af Amer: 88 mL/min/{1.73_m2} (ref >59)
GFR calc non Af Amer: 76 mL/min/{1.73_m2} (ref >59)
Glucose: 193 mg/dL — ABNORMAL HIGH (ref 65–99)
Potassium: 4.2 mmol/L (ref 3.5–5.2)
Sodium: 138 mmol/L (ref 134–144)

## 2019-09-26 ENCOUNTER — Telehealth: Payer: Self-pay

## 2019-09-26 NOTE — Telephone Encounter (Signed)
Patient LVM on nurse line requesting lab results. Please advise.

## 2019-09-29 NOTE — Assessment & Plan Note (Signed)
A1C worsened from 7.6 to 8.0 today. He is compliant with diabetes medications.  He is continuing to workout daily 5 days a week.  Applauded him for this success.  Weight is stable since last visit.  Currently on glipizide 5 mg twice daily and Metformin 1000 mg twice daily.  Discussed patient with Dr. Raymondo Band who recommended an appointment with him to further evaluate other options such as SGLT2 or GLP-1 and review financial assistance options with patient's Medicare insurance.  Schedule patient for 4/26 with Dr. Raymondo Band.  We will continue current medications as prescribed.  Can consider increasing glipizide to 10 mg twice daily however he has had history of hypoglycemia at this dose.  Will follow up with Dr. Raymondo Band recommendations.  Patient to follow-up in 3 months for repeat A1c and further medication adjustments, sooner if complications.

## 2019-09-29 NOTE — Assessment & Plan Note (Signed)
Blood pressure initially above goal, but much improved on repeat measuring.  Tolerating lisinopril well.  We will continue current medications as prescribed.

## 2019-09-29 NOTE — Assessment & Plan Note (Signed)
Last lipid panel in 2021 with well-controlled LDL.  Does have elevated triglycerides.  Tolerating atorvastatin 40 mg daily.  Can consider addition of fibrate/niacin/fish oil in future.

## 2019-09-29 NOTE — Telephone Encounter (Signed)
Attempted to call patient several times without success.  Left multiple voice messages for call back.  Left details of lab results in result note for labs.  Okay for nurse to relay results.

## 2019-10-02 ENCOUNTER — Other Ambulatory Visit: Payer: Self-pay | Admitting: Family Medicine

## 2019-10-02 DIAGNOSIS — E785 Hyperlipidemia, unspecified: Secondary | ICD-10-CM

## 2019-10-02 DIAGNOSIS — E1169 Type 2 diabetes mellitus with other specified complication: Secondary | ICD-10-CM

## 2019-10-07 ENCOUNTER — Ambulatory Visit: Payer: Medicare Other | Admitting: Pharmacist

## 2019-10-30 ENCOUNTER — Encounter: Payer: Self-pay | Admitting: Podiatry

## 2019-10-30 ENCOUNTER — Other Ambulatory Visit: Payer: Self-pay

## 2019-10-30 ENCOUNTER — Ambulatory Visit: Payer: Medicare Other | Admitting: Podiatry

## 2019-10-30 VITALS — Temp 97.5°F

## 2019-10-30 DIAGNOSIS — M79675 Pain in left toe(s): Secondary | ICD-10-CM

## 2019-10-30 DIAGNOSIS — B351 Tinea unguium: Secondary | ICD-10-CM | POA: Diagnosis not present

## 2019-10-30 DIAGNOSIS — M79674 Pain in right toe(s): Secondary | ICD-10-CM

## 2019-10-30 DIAGNOSIS — E1142 Type 2 diabetes mellitus with diabetic polyneuropathy: Secondary | ICD-10-CM | POA: Diagnosis not present

## 2019-10-30 NOTE — Progress Notes (Signed)
This patient returns to my office for at risk foot care.  This patient requires this care by a professional since this patient will be at risk due to having  Diabetic neuropathy.  This patient is unable to cut nails himself since the patient cannot reach his nails.These nails are painful walking and wearing shoes.  This patient presents for at risk foot care today.  General Appearance  Alert, conversant and in no acute stress.  Vascular  Dorsalis pedis and posterior tibial  pulses are palpable  bilaterally.  Capillary return is within normal limits  bilaterally. Temperature is within normal limits  bilaterally.  Neurologic  Senn-Weinstein monofilament wire test within normal limits  bilaterally. Muscle power within normal limits bilaterally.  Nails Thick disfigured discolored nails with subungual debris  from hallux to fifth toes bilaterally. No evidence of bacterial infection or drainage bilaterally.  Orthopedic  No limitations of motion  feet .  No crepitus or effusions noted.  HAV  B/L.  Hammer toes  B/L.  Skin  normotropic skin with no porokeratosis noted bilaterally.  No signs of infections or ulcers noted.     Onychomycosis  Pain in right toes  Pain in left toes  Consent was obtained for treatment procedures.   Mechanical debridement of nails 1-5  bilaterally performed with a nail nipper.  Filed with dremel without incident.    Return office visit  10 weeks                    Told patient to return for periodic foot care and evaluation due to potential at risk complications.   Khadeejah Castner DPM  

## 2019-11-07 ENCOUNTER — Ambulatory Visit: Payer: Medicare Other | Admitting: Pharmacist

## 2019-11-14 ENCOUNTER — Ambulatory Visit (INDEPENDENT_AMBULATORY_CARE_PROVIDER_SITE_OTHER): Payer: Medicare Other | Admitting: Pharmacist

## 2019-11-14 ENCOUNTER — Other Ambulatory Visit: Payer: Self-pay

## 2019-11-14 DIAGNOSIS — E1169 Type 2 diabetes mellitus with other specified complication: Secondary | ICD-10-CM | POA: Diagnosis not present

## 2019-11-14 DIAGNOSIS — E119 Type 2 diabetes mellitus without complications: Secondary | ICD-10-CM | POA: Diagnosis not present

## 2019-11-14 DIAGNOSIS — I10 Essential (primary) hypertension: Secondary | ICD-10-CM

## 2019-11-14 MED ORDER — LISINOPRIL 2.5 MG PO TABS: 2.5000 mg | ORAL_TABLET | Freq: Every day | ORAL | 3 refills | Status: DC

## 2019-11-14 NOTE — Assessment & Plan Note (Signed)
Diabetes longstanding currently uncontrolled. BG readings have improved recently due to dietary changes Medication adherence appears optimal.  -Stop glipizide -Continue metformin 1000mg  twice daily -Started SGLT2-I Jardiance (generic name empagliflozin) 10 mg daily. Counseled on sick day rules for Jaridance. Medication Samples have been provided to the patient.  Drug name: Jardiance     Strength: 10 mg        Qty: 14 tablets LOT: Exp.Date: [id number] Dosing instructions: Take one tablet by mouth daily  -The patient has been instructed regarding the correct time, dose, and frequency of taking this medication, including desired effects and most common side effects.  -Extensively discussed pathophysiology of diabetes, recommended lifestyle interventions, dietary effects on blood sugar control -Counseled on s/sx of and management of hypoglycemia -Next A1C anticipated 12/2019.

## 2019-11-14 NOTE — Progress Notes (Signed)
S:     Chief Complaint  Patient presents with  . Medication Management    DM    Patient arrives in good spirits ambulating without assistance.  Presents for diabetes evaluation, education, and management. Patient was referred and last seen by Primary Care Provider, Dr. Tarry Kos on 09/24/2019.  Patient has been out of glipizide for about 1.5 days and did not obtain a refill since he was under the impression that he would start a new DM medication at this visit. He reports making significant dietary changes and continues to exercise regularly.  Patient reports Diabetes was diagnosed in 6-7 years ago  Family/Social History: father (DM), 2/3 brothers (DM), 3/4 sisters (DM)  Insurance coverage/medication affordability: [employer] Medicare   Medication adherence reported.   Current diabetes medications include: metformin IR 1000 mg twice daily Current hypertension medications include: lisinopril 2.5 mg daily (likely used for nephroprotection) Current hyperlipidemia medications include: atorvastatin 40 mg daily  Patient denies hypoglycemic events.  Patient reported dietary habits: Eats 4 meals/day Breakfast: oatmeal, craisins, frozen fruit, almond milk Lunch: bean vegetable mixture Dinner: same thing as he has for lunch, vegetables, protein Snacks: orange Drinks: diet ginger ale over 1 week, water, cranberry juice mixed with water and coffee   Patient-reported exercise habits: 1.5 hours for first workout of day, 30 min for second workout of the day (works out 6 days per week); uses bike   Patient denies nocturia (nighttime urination).  Patient denies neuropathy (nerve pain). Patient reports visual changes - sees floaters daily. Patient reports he follows with ophthalmology - states they say he has "cataracts starting". Patient denies self foot exams.    O:  Physical Exam Constitutional:      Appearance: Normal appearance. He is normal weight.  Pulmonary:     Effort: Pulmonary effort  is normal.  Neurological:     Mental Status: He is alert.  Psychiatric:        Mood and Affect: Mood normal.        Thought Content: Thought content normal.      Review of Systems  All other systems reviewed and are negative.    Lab Results  Component Value Date   HGBA1C 8.0 (A) 09/24/2019   There were no vitals filed for this visit.  Lipid Panel     Component Value Date/Time   CHOL 117 06/26/2019 1628   TRIG 212 (H) 06/26/2019 1628   HDL 46 06/26/2019 1628   CHOLHDL 2.5 06/26/2019 1628   CHOLHDL 3.7 07/26/2010 0829   VLDL 18 07/26/2010 0829   LDLCALC 38 06/26/2019 1628   LDLDIRECT 105 (H) 08/18/2014 1637    Home fasting blood sugars: 180-190 (highest BG is 215)  A/P: Diabetes longstanding currently uncontrolled. BG readings have improved recently due to dietary changes Medication adherence appears optimal.  -Stop glipizide -Continue metformin 1000mg  twice daily -Started SGLT2-I Jardiance (generic name empagliflozin) 10 mg daily. Counseled on sick day rules for Jaridance. Medication Samples have been provided to the patient. Drug name: Jardiance     Strength: 10 mg        Qty: 14 tablets LOT: [id number] Exp.Date: [id number] Dosing instructions: Take one tablet by mouth daily  -The patient has been instructed regarding the correct time, dose, and frequency of taking this medication, including desired effects and most common side effects.  -Extensively discussed pathophysiology of diabetes, recommended lifestyle interventions, dietary effects on blood sugar control -Counseled on s/sx of and management of hypoglycemia -Next A1C anticipated 12/2019.  Written patient instructions provided.  Total time in face to face counseling 45 minutes.   Follow up via phone call in 2-3 weeks.   Patient seen with Lucina Mellow, PharmD Candidate, Bartholomew Crews, PharmD Candidate, Tama Headings, PharmD PGY-1 Resident. and Zachery Conch, PharmD, PGY2 Pharmacy Resident.

## 2019-11-14 NOTE — Patient Instructions (Addendum)
It was a pleasure seeing you in clinic today!  Today the plan is... 1. START Jardiance 10 mg daily 2. Continue metformin 1000 mg twice daily 3. STOP glipizide 4. Tresa Endo will call you about Jardiance patient assistance program application to get medication for $0 from drug manufacturer [employer]). Remember to fill out application and provide proof of income documentation  5. I sent lisinopril prescription to your pharmacy

## 2019-11-15 NOTE — Progress Notes (Signed)
Reviewed: I agree with Dr. Koval's documentation and management. 

## 2019-11-27 ENCOUNTER — Telehealth: Payer: Self-pay | Admitting: *Deleted

## 2019-11-27 NOTE — Telephone Encounter (Signed)
Pt feels like the Jardiance is not working as well and the glipizide and would like to discuss this with Dr. Raymondo Band.  Jone Baseman, CMA

## 2019-11-28 NOTE — Telephone Encounter (Signed)
Please have patient schedule a visit (virtual or in-person) to further discuss. He should continue this medication until we talk. Thank you.

## 2019-12-02 ENCOUNTER — Other Ambulatory Visit: Payer: Self-pay | Admitting: Family Medicine

## 2019-12-02 DIAGNOSIS — E119 Type 2 diabetes mellitus without complications: Secondary | ICD-10-CM

## 2019-12-02 NOTE — Telephone Encounter (Signed)
LMOVM for pt to call back and schedule an appt with pcp. Lateefa Crosby Bruna Potter, CMA

## 2019-12-06 ENCOUNTER — Telehealth: Payer: Self-pay | Admitting: Pharmacist

## 2019-12-06 NOTE — Telephone Encounter (Signed)
Phone call conducted by Lucina Mellow, PharmD Candidate.  Called patient in response to his concern that [place] Pepper was not working as well as glipizide for his diabetes. Patient was seen in clinic earlier this month by pharmacy team and given 14-day supply of Jardiance 10 mg. Patient reports that blood sugars were higher when taking Jardiance (low 200s) than when taking glipizide (mid- to low- 100s). Patient ran out of Jardiance sample and started back on glipizide on his own accord. Patient also reports getting plenty of exercise, as he is determined to keep blood sugars down.  Asked patient whether he would be interested in trying Jardiance again, but at a higher dose of 25 mg. Patient was hesitant, as he does not believe Jardiance works as well as glipizide. Counseled patient that effect of glipizide may wane over time, and that Jardiance has added cardio- and renoprotective benefits. After this, patient was open to trying new plan but is in no rush to get started, stating he wants to keep using glipizide for another few weeks.  Plan for patient to continue glipizide for now and will reach out to him again next week to discuss next steps.

## 2019-12-19 ENCOUNTER — Telehealth: Payer: Self-pay | Admitting: Pharmacist

## 2019-12-19 DIAGNOSIS — E1169 Type 2 diabetes mellitus with other specified complication: Secondary | ICD-10-CM

## 2019-12-19 NOTE — Telephone Encounter (Signed)
Phone contact to patient to reevaluate blood glucose control.   Patient reports readings in the low 100s with a reading as low as 88mg /dl on current regimen of metformin 1000mg  BID and glipizide 5mg  BID.   He is NOT taking any Jardiance.  He felt that it did not do a good job with controlling his blood sugars (more likely due to stopping glipizide) and then returned to using glipizide with metformin.   He is willing to continue current regimen including meds and exercise plan until next PCP visit with Dr. .  He plans to come for visit in the next 4-6 weeks.

## 2019-12-19 NOTE — Telephone Encounter (Signed)
Noted and agree. 

## 2019-12-19 NOTE — Assessment & Plan Note (Signed)
Phone contact to patient to reevaluate blood glucose control.   Patient reports readings in the low 100s with a reading as low as 88mg/dl on current regimen of metformin 1000mg BID and glipizide 5mg BID.   He is NOT taking any Jardiance.  He felt that it did not do a good job with controlling his blood sugars (more likely due to stopping glipizide) and then returned to using glipizide with metformin.   He is willing to continue current regimen including meds and exercise plan until next PCP visit with Dr. Mullis.  He plans to come for visit in the next 4-6 weeks.  

## 2020-01-14 ENCOUNTER — Ambulatory Visit: Payer: Medicare Other | Admitting: Podiatry

## 2020-02-29 ENCOUNTER — Other Ambulatory Visit: Payer: Self-pay | Admitting: Family Medicine

## 2020-02-29 DIAGNOSIS — E119 Type 2 diabetes mellitus without complications: Secondary | ICD-10-CM

## 2020-03-01 NOTE — Progress Notes (Signed)
   Subjective:   Patient ID: Calvin Newton    DOB: 04-20-1951, 69 y.o. male   MRN: [id number]  Calvin Newton is a 69 y.o. male with a history of HTN, T2DM, HLD, diabetic neuropathy, chronic RLQ abdominal pain, enlarged prostate  here for diabetes follow up.  Diabetes: Last three A1C's below. Currently on Metformin 1000mg  BID and Glipizide 5mg  BID. Endorses compliance. Notes CBGs range 100-200's, highest 245. Denies any hypoglycemia. Denies any polyuria, polydipsia, polyphagia. Due for diabetic foot exam and eye exam. Notes he has been working out every day and building muscle. He has lost 4 lbs.   Lab Results  Component Value Date   HGBA1C 7.4 (A) 03/03/2020   HGBA1C 8.0 (A) 09/24/2019   HGBA1C 7.6 (A) 06/26/2019    HTN:  BP: 128/80 today. Currently on Lisinopril 2.5mg  QD. Endorses compliance. Non-smoker. Denies any chest pain, SOB, vision changes, or headaches.  Last BMP in 09/2019 with stable kidney function.  Health Maintenance: Due for flu vaccine   Review of Systems:  Per HPI.   Objective:   BP 128/80   Pulse 91   Wt 272 lb 6.4 oz (123.6 kg)   SpO2 95%   BMI 37.99 kg/m  Vitals and nursing note reviewed.  General: pleasant older male, sitting comfortably in exam chair, well nourished, well developed, in no acute distress with non-toxic appearance Resp: breathing comfortably on room air, speaking in full sentences Skin: warm, dry Extremities: warm and well perfused, 2+ pedal and dorsalis pedis pulses bilaterally. Diabetic foot exam performed  MSK: ROM grossly intact, strength intact, gait normal Neuro: Alert and oriented, speech normal  Diabetic foot exam was performed with the following findings:   No deformities, ulcerations, or other skin breakdown Normal sensation of 10g monofilament Intact posterior tibialis and dorsalis pedis pulses Thickened toes bilaterally Decreased sensation of right great toe  Sees podiatrist regularly for foot care     Assessment  & Plan:   Essential hypertension Well controlled. Currently on Lisinopril 2.5mg  QD for kidney protection. Denies symptoms. - continue current medications  Type 2 diabetes mellitus with other specified complication (HCC) Chronic, improving. A1C improved from 8.0 to 7.4 today. He has been working out and eating better and has lost 4lbs since last visit. Congratulated patient on success. Had long discussion on mortality benefits with different diabetes medications.  - After extensive conversation, opted to discontinue Glipizide.  - Continue Metformin 1000mg  BID - start Farxiga 10mg  QD (appears to be covered by insurance) - follow up 1 week for repeat BMP - patient instructed to stop monitoring blood sugars so closely given how well controlled he is - Diabetic foot exam performed - patient instructed to schedule diabetic eye exam at earliest convenience - follow up 3 months for check up   Health Maintenance: Flu vaccine given today Patient instructed to call to schedule diabetic eye exam  Orders Placed This Encounter  Procedures  . Flu Vaccine QUAD High Dose(Fluad)  . POCT glycosylated hemoglobin (Hb A1C)   Meds ordered this encounter  Medications  . dapagliflozin propanediol (FARXIGA) 10 MG TABS tablet    Sig: Take 1 tablet (10 mg total) by mouth daily.    Dispense:  30 tablet    Refill:  2    10/2019, DO PGY-3, [facility] 03/06/2020 8:18 AM

## 2020-03-03 ENCOUNTER — Other Ambulatory Visit: Payer: Self-pay

## 2020-03-03 ENCOUNTER — Encounter: Payer: Self-pay | Admitting: Family Medicine

## 2020-03-03 ENCOUNTER — Ambulatory Visit (INDEPENDENT_AMBULATORY_CARE_PROVIDER_SITE_OTHER): Payer: Medicare Other | Admitting: Family Medicine

## 2020-03-03 VITALS — BP 128/80 | HR 91 | Wt 272.4 lb

## 2020-03-03 DIAGNOSIS — E119 Type 2 diabetes mellitus without complications: Secondary | ICD-10-CM

## 2020-03-03 DIAGNOSIS — Z23 Encounter for immunization: Secondary | ICD-10-CM

## 2020-03-03 DIAGNOSIS — I10 Essential (primary) hypertension: Secondary | ICD-10-CM | POA: Diagnosis not present

## 2020-03-03 LAB — POCT GLYCOSYLATED HEMOGLOBIN (HGB A1C): HbA1c, POC (controlled diabetic range): 7.4 % — AB (ref 0.0–7.0)

## 2020-03-03 NOTE — Patient Instructions (Signed)
Thank you for coming to see me today. It was a pleasure to see you.   As we discussed, stop checking your blood sugars.  It is not necessary. Stop the glipizide. Continue the Metformin. I will call you with the results of your A1c and depending on the results we will consider starting Farxiga.  This medicine is similar to [place] and you will need to take this medicine once a day. Remember to schedule your diabetic eye exam at your earliest convenience.  Please have them send the results to our clinic.  Please follow-up with me in 3 months.  If you have any questions or concerns, please do not hesitate to call the office at [phone].  Take Care,  Dr. Orpah Cobb, DO Resident Physician [facility] [phone]

## 2020-03-06 MED ORDER — DAPAGLIFLOZIN PROPANEDIOL 10 MG PO TABS: 10.0000 mg | ORAL_TABLET | Freq: Every day | ORAL | 2 refills | Status: DC

## 2020-03-06 NOTE — Assessment & Plan Note (Signed)
Well controlled. Currently on Lisinopril 2.5mg  QD for kidney protection. Denies symptoms. - continue current medications

## 2020-03-06 NOTE — Assessment & Plan Note (Signed)
Chronic, improving. A1C improved from 8.0 to 7.4 today. He has been working out and eating better and has lost 4lbs since last visit. Congratulated patient on success. Had long discussion on mortality benefits with different diabetes medications.  - After extensive conversation, opted to discontinue Glipizide.  - Continue Metformin 1000mg  BID - start Farxiga 10mg  QD (appears to be covered by insurance) - follow up 1 week for repeat BMP - patient instructed to stop monitoring blood sugars so closely given how well controlled he is - Diabetic foot exam performed - patient instructed to schedule diabetic eye exam at earliest convenience - follow up 3 months for check up

## 2020-03-09 ENCOUNTER — Other Ambulatory Visit: Payer: Self-pay

## 2020-03-10 MED ORDER — METFORMIN HCL 1000 MG PO TABS: ORAL_TABLET | ORAL | 2 refills | Status: DC

## 2020-03-13 ENCOUNTER — Other Ambulatory Visit: Payer: Self-pay | Admitting: *Deleted

## 2020-03-13 DIAGNOSIS — I10 Essential (primary) hypertension: Secondary | ICD-10-CM

## 2020-03-13 DIAGNOSIS — E119 Type 2 diabetes mellitus without complications: Secondary | ICD-10-CM

## 2020-03-13 MED ORDER — LISINOPRIL 2.5 MG PO TABS: 2.5000 mg | ORAL_TABLET | Freq: Every day | ORAL | 3 refills | Status: DC

## 2020-03-13 MED ORDER — DAPAGLIFLOZIN PROPANEDIOL 10 MG PO TABS: 10.0000 mg | ORAL_TABLET | Freq: Every day | ORAL | 2 refills | Status: DC

## 2020-03-18 ENCOUNTER — Other Ambulatory Visit: Payer: Self-pay

## 2020-03-18 ENCOUNTER — Other Ambulatory Visit: Payer: Medicare Other

## 2020-03-18 DIAGNOSIS — E119 Type 2 diabetes mellitus without complications: Secondary | ICD-10-CM | POA: Diagnosis not present

## 2020-03-19 ENCOUNTER — Other Ambulatory Visit: Payer: Self-pay | Admitting: Family Medicine

## 2020-03-19 DIAGNOSIS — E119 Type 2 diabetes mellitus without complications: Secondary | ICD-10-CM

## 2020-03-19 LAB — BASIC METABOLIC PANEL
BUN/Creatinine Ratio: 11 (ref 10–24)
BUN: 14 mg/dL (ref 8–27)
CO2: 22 mmol/L (ref 20–29)
Calcium: 9.5 mg/dL (ref 8.6–10.2)
Chloride: 101 mmol/L (ref 96–106)
Creatinine, Ser: 1.25 mg/dL (ref 0.76–1.27)
GFR calc Af Amer: 68 mL/min/{1.73_m2} (ref >59)
GFR calc non Af Amer: 59 mL/min/{1.73_m2} — ABNORMAL LOW (ref >59)
Glucose: 159 mg/dL — ABNORMAL HIGH (ref 65–99)
Potassium: 4.8 mmol/L (ref 3.5–5.2)
Sodium: 138 mmol/L (ref 134–144)

## 2020-03-26 ENCOUNTER — Other Ambulatory Visit: Payer: Medicare Other

## 2020-03-26 ENCOUNTER — Other Ambulatory Visit: Payer: Self-pay

## 2020-03-26 DIAGNOSIS — E119 Type 2 diabetes mellitus without complications: Secondary | ICD-10-CM | POA: Diagnosis not present

## 2020-03-27 LAB — BASIC METABOLIC PANEL
BUN/Creatinine Ratio: 10 (ref 10–24)
BUN: 14 mg/dL (ref 8–27)
CO2: 24 mmol/L (ref 20–29)
Calcium: 9.9 mg/dL (ref 8.6–10.2)
Chloride: 103 mmol/L (ref 96–106)
Creatinine, Ser: 1.34 mg/dL — ABNORMAL HIGH (ref 0.76–1.27)
GFR calc Af Amer: 63 mL/min/{1.73_m2} (ref >59)
GFR calc non Af Amer: 54 mL/min/{1.73_m2} — ABNORMAL LOW (ref >59)
Glucose: 182 mg/dL — ABNORMAL HIGH (ref 65–99)
Potassium: 4.7 mmol/L (ref 3.5–5.2)
Sodium: 139 mmol/L (ref 134–144)

## 2020-04-01 ENCOUNTER — Encounter: Payer: Self-pay | Admitting: Podiatry

## 2020-04-01 ENCOUNTER — Ambulatory Visit: Payer: Medicare Other | Admitting: Podiatry

## 2020-04-01 ENCOUNTER — Other Ambulatory Visit: Payer: Self-pay

## 2020-04-01 DIAGNOSIS — B351 Tinea unguium: Secondary | ICD-10-CM | POA: Diagnosis not present

## 2020-04-01 DIAGNOSIS — M79674 Pain in right toe(s): Secondary | ICD-10-CM

## 2020-04-01 DIAGNOSIS — E1142 Type 2 diabetes mellitus with diabetic polyneuropathy: Secondary | ICD-10-CM

## 2020-04-01 DIAGNOSIS — M79675 Pain in left toe(s): Secondary | ICD-10-CM | POA: Diagnosis not present

## 2020-04-01 NOTE — Progress Notes (Signed)
This patient returns to my office for at risk foot care.  This patient requires this care by a professional since this patient will be at risk due to having  Diabetic neuropathy.  This patient is unable to cut nails himself since the patient cannot reach his nails.These nails are painful walking and wearing shoes.  This patient presents for at risk foot care today.  General Appearance  Alert, conversant and in no acute stress.  Vascular  Dorsalis pedis and posterior tibial  pulses are palpable  bilaterally.  Capillary return is within normal limits  bilaterally. Temperature is within normal limits  bilaterally.  Neurologic  Senn-Weinstein monofilament wire test within normal limits  bilaterally. Muscle power within normal limits bilaterally.  Nails Thick disfigured discolored nails with subungual debris  from hallux to fifth toes bilaterally. No evidence of bacterial infection or drainage bilaterally.  Orthopedic  No limitations of motion  feet .  No crepitus or effusions noted.  HAV  B/L.  Hammer toes  B/L.  Skin  normotropic skin with no porokeratosis noted bilaterally.  No signs of infections or ulcers noted.     Onychomycosis  Pain in right toes  Pain in left toes  Consent was obtained for treatment procedures.   Mechanical debridement of nails 1-5  bilaterally performed with a nail nipper.  Filed with dremel without incident.    Return office visit  10 weeks                    Told patient to return for periodic foot care and evaluation due to potential at risk complications.   Quavion Boule DPM  

## 2020-04-06 LAB — HM DIABETES EYE EXAM

## 2020-04-08 DIAGNOSIS — E119 Type 2 diabetes mellitus without complications: Secondary | ICD-10-CM | POA: Diagnosis not present

## 2020-04-08 DIAGNOSIS — H40033 Anatomical narrow angle, bilateral: Secondary | ICD-10-CM | POA: Diagnosis not present

## 2020-04-13 ENCOUNTER — Other Ambulatory Visit: Payer: Self-pay | Admitting: Family Medicine

## 2020-04-13 DIAGNOSIS — N183 Chronic kidney disease, stage 3 unspecified: Secondary | ICD-10-CM

## 2020-04-13 DIAGNOSIS — N182 Chronic kidney disease, stage 2 (mild): Secondary | ICD-10-CM

## 2020-04-27 ENCOUNTER — Telehealth: Payer: Self-pay | Admitting: Family Medicine

## 2020-04-27 ENCOUNTER — Other Ambulatory Visit: Payer: Medicare Other

## 2020-04-27 ENCOUNTER — Other Ambulatory Visit: Payer: Self-pay

## 2020-04-27 DIAGNOSIS — E1122 Type 2 diabetes mellitus with diabetic chronic kidney disease: Secondary | ICD-10-CM | POA: Diagnosis not present

## 2020-04-27 DIAGNOSIS — N182 Chronic kidney disease, stage 2 (mild): Secondary | ICD-10-CM | POA: Diagnosis not present

## 2020-04-27 DIAGNOSIS — N183 Chronic kidney disease, stage 3 unspecified: Secondary | ICD-10-CM | POA: Diagnosis not present

## 2020-04-27 NOTE — Telephone Encounter (Signed)
Pt is here for lab appointment; Reports having a problem with the Farxiga medication; pls call [phone]

## 2020-04-28 ENCOUNTER — Other Ambulatory Visit: Payer: Self-pay | Admitting: Family Medicine

## 2020-04-28 DIAGNOSIS — B3749 Other urogenital candidiasis: Secondary | ICD-10-CM

## 2020-04-28 DIAGNOSIS — E1169 Type 2 diabetes mellitus with other specified complication: Secondary | ICD-10-CM

## 2020-04-28 LAB — BASIC METABOLIC PANEL
BUN/Creatinine Ratio: 11 (ref 10–24)
BUN: 14 mg/dL (ref 8–27)
CO2: 26 mmol/L (ref 20–29)
Calcium: 10.1 mg/dL (ref 8.6–10.2)
Chloride: 100 mmol/L (ref 96–106)
Creatinine, Ser: 1.25 mg/dL (ref 0.76–1.27)
GFR calc Af Amer: 68 mL/min/{1.73_m2} (ref >59)
GFR calc non Af Amer: 59 mL/min/{1.73_m2} — ABNORMAL LOW (ref >59)
Glucose: 182 mg/dL — ABNORMAL HIGH (ref 65–99)
Potassium: 4.6 mmol/L (ref 3.5–5.2)
Sodium: 138 mmol/L (ref 134–144)

## 2020-04-28 MED ORDER — FLUCONAZOLE 150 MG PO TABS: ORAL_TABLET | ORAL | 5 refills | Status: DC

## 2020-04-28 NOTE — Telephone Encounter (Signed)
Spoke to patient. Symptoms of yeast infection after starting Farxiga. Will treat empirically with Diflucan 150mg  x2.  Follow up if no improvement Instructed to drink plenty of water.

## 2020-04-28 NOTE — Telephone Encounter (Signed)
Patient calls nurse line stating that he was returning phone call to provider. I am unable to find documentation of this phone call.   Will route to PCP  Veronda Prude, RN

## 2020-05-19 ENCOUNTER — Telehealth: Payer: Self-pay

## 2020-05-19 NOTE — Telephone Encounter (Signed)
Patient calls nurse line regarding adverse effects of Farxiga. Patient reports redness and burning, and slight scarring right under the tip of the penis. Redness on the head of penis.   Denies itching, difficulties with urination and fever. Reports irritation around foreskin, that has since subsided since taking diflucan.   Patient reports that he has stopped taking medication for now.   Patient has follow up appointment on 05/27/20. Please advise additional recommendations in the meantime.   Veronda Prude, RN

## 2020-05-21 NOTE — Telephone Encounter (Signed)
Agree with discontinuation at this time. Will follow up as scheduled to further assess.

## 2020-05-27 ENCOUNTER — Ambulatory Visit (INDEPENDENT_AMBULATORY_CARE_PROVIDER_SITE_OTHER): Payer: Medicare Other | Admitting: Family Medicine

## 2020-05-27 ENCOUNTER — Encounter: Payer: Self-pay | Admitting: Family Medicine

## 2020-05-27 ENCOUNTER — Other Ambulatory Visit: Payer: Self-pay

## 2020-05-27 VITALS — BP 122/64 | HR 89 | Ht 71.0 in | Wt 269.8 lb

## 2020-05-27 DIAGNOSIS — I10 Essential (primary) hypertension: Secondary | ICD-10-CM | POA: Diagnosis not present

## 2020-05-27 DIAGNOSIS — E785 Hyperlipidemia, unspecified: Secondary | ICD-10-CM | POA: Diagnosis not present

## 2020-05-27 DIAGNOSIS — E1169 Type 2 diabetes mellitus with other specified complication: Secondary | ICD-10-CM | POA: Diagnosis not present

## 2020-05-27 LAB — POCT GLYCOSYLATED HEMOGLOBIN (HGB A1C): Hemoglobin A1C: 8 % — AB (ref 4.0–5.6)

## 2020-05-27 MED ORDER — TRULICITY 0.75 MG/0.5ML ~~LOC~~ SOAJ: 0.7500 mg | SUBCUTANEOUS | 0 refills | Status: DC

## 2020-05-27 NOTE — Progress Notes (Signed)
   Subjective:   Patient ID: Calvin Newton    DOB: June 07, 1951, 69 y.o. male   MRN: [id number]  Calvin Newton is a 69 y.o. male with a history of HTN, diabetic neuropathy, HLD, T2DM, chronic RLQ abdominal pain, enlarged prostate here for diabetes follow up  Diabetes: Last three A1C's below. Currently on Metformin 1000mg  BID. Was started on Farxiga 10mg  QD but discontinued due to penile irritation. History of taking Glipizide which was discontinued when starting 06-17-2002. Endorses compliance. Denies any hypoglycemia. Denies any polyuria, polydipsia, polyphagia. Due for diabetic eye exam. Continues to exercise on elliptical. Continues to lose weight: down another 3lbs, with total of 10 lbs since Jan 2021.  Lab Results  Component Value Date   HGBA1C 8.0 (A) 05/27/2020   HGBA1C 7.4 (A) 03/03/2020   HGBA1C 8.0 (A) 09/24/2019    HTN:  BP: 122/64 today. Currently on Lisinopril 2.5mg  QD for kidney protection. Endorses compliance. Non-smoker. Denies any chest pain, SOB, vision changes, or headaches.    Health Maintenance: Health Maintenance Due  Topic  . OPHTHALMOLOGY EXAM    No family history of breast or colon cancer to suggest early screening.  Review of Systems:  Per HPI.   Objective:   BP 122/64   Pulse 89   Ht 5\' 11"  (1.803 m)   Wt 269 lb 12.8 oz (122.4 kg)   SpO2 95%   BMI 37.63 kg/m  Vitals and nursing note reviewed.  General: pleasant older gentleman, sitting comfortably in exam chair, well nourished, well developed, in no acute distress with non-toxic appearance Resp: breathing comfortably on room air, speaking in full sentences MSK: gait normal Neuro: Alert and oriented, speech normal  Assessment & Plan:   Essential hypertension Well controlled. Currently on Lisinopril 2.5mg  for kidney protection. - continue Lisinopril 2.5mg  QD - BMP after the new year  Hyperlipidemia associated with type 2 diabetes mellitus (HCC) Plan for lipid panel at 3 month follow  up  Type 2 diabetes mellitus with other specified complication (HCC) Chronic, worsening. A1C increased from 7.4 to 8.0. Likely given he had stopped the 03/05/2020 due to intolerance. Penile redness has resolved since stopping the farxiga. Continues Metformin 1000mg  BID without complication. Continue to encourage to limit sugar drinks/juice consumption. - Continue Metformin 1000mg  BID - Stop Farxiga - Start Trulicity 0.75mg  q weekly, plan to increase to 1.5mg  weekly after 4 weeks - Diabetic eye exam completed 2-3 months ago. Has had them fax over the results but will also bring the resul;ts in at folow up - Congratulated patient on success of continued weight loss and exercise  Orders Placed This Encounter  Procedures  . POCT glycosylated hemoglobin (Hb A1C)   Meds ordered this encounter  Medications  . Dulaglutide (TRULICITY) 0.75 MG/0.5ML SOPN    Sig: Inject 0.75 mg into the skin once a week.    Dispense:  2 mL    Refill:  0   09/26/2019, DO PGY-3, [facility] 05/28/2020 3:40 PM

## 2020-05-27 NOTE — Patient Instructions (Signed)
It was a pleasure to see you today!  Thank you for choosing [facility] for your primary care.   Our plans for today were:  Please start Trulicity 0.75mg : inject into stomach once a week for four weeks. After 4 weeks, we will increase the dose. So just have the pharmacy send in a request and I will get it sent to you.  Call me if too expensive  Please bring your diabetic eye exam results with you at your follow up visit  Please follow up with me in 3 months  Best Wishes,   Orpah Cobb, DO

## 2020-05-28 NOTE — Assessment & Plan Note (Signed)
Chronic, worsening. A1C increased from 7.4 to 8.0. Likely given he had stopped the [place] due to intolerance. Penile redness has resolved since stopping the farxiga. Continues Metformin 1000mg  BID without complication. Continue to encourage to limit sugar drinks/juice consumption. - Continue Metformin 1000mg  BID - Stop Farxiga - Start Trulicity 0.75mg  q weekly, plan to increase to 1.5mg  weekly after 4 weeks - Diabetic eye exam completed 2-3 months ago. Has had them fax over the results but will also bring the resul;ts in at folow up - Congratulated patient on success of continued weight loss and exercise

## 2020-05-28 NOTE — Assessment & Plan Note (Signed)
Well controlled. Currently on Lisinopril 2.5mg  for kidney protection. - continue Lisinopril 2.5mg  QD - BMP after the new year

## 2020-05-28 NOTE — Assessment & Plan Note (Signed)
Plan for lipid panel at 3 month follow up

## 2020-06-22 ENCOUNTER — Other Ambulatory Visit: Payer: Self-pay | Admitting: Family Medicine

## 2020-06-22 DIAGNOSIS — E1169 Type 2 diabetes mellitus with other specified complication: Secondary | ICD-10-CM

## 2020-06-22 MED ORDER — TRULICITY 1.5 MG/0.5ML ~~LOC~~ SOAJ: 1.5000 mg | SUBCUTANEOUS | 3 refills | Status: DC

## 2020-06-23 ENCOUNTER — Other Ambulatory Visit: Payer: Self-pay | Admitting: *Deleted

## 2020-06-23 DIAGNOSIS — E1169 Type 2 diabetes mellitus with other specified complication: Secondary | ICD-10-CM

## 2020-06-23 MED ORDER — TRULICITY 1.5 MG/0.5ML ~~LOC~~ SOAJ: 1.5000 mg | SUBCUTANEOUS | 3 refills | Status: DC

## 2020-07-08 ENCOUNTER — Other Ambulatory Visit: Payer: Self-pay

## 2020-07-08 ENCOUNTER — Encounter: Payer: Self-pay | Admitting: Podiatry

## 2020-07-08 ENCOUNTER — Ambulatory Visit: Payer: Medicare Other | Admitting: Podiatry

## 2020-07-08 DIAGNOSIS — R1310 Dysphagia, unspecified: Secondary | ICD-10-CM

## 2020-07-08 DIAGNOSIS — M79675 Pain in left toe(s): Secondary | ICD-10-CM | POA: Diagnosis not present

## 2020-07-08 DIAGNOSIS — M79674 Pain in right toe(s): Secondary | ICD-10-CM | POA: Diagnosis not present

## 2020-07-08 DIAGNOSIS — K219 Gastro-esophageal reflux disease without esophagitis: Secondary | ICD-10-CM | POA: Insufficient documentation

## 2020-07-08 DIAGNOSIS — E1142 Type 2 diabetes mellitus with diabetic polyneuropathy: Secondary | ICD-10-CM

## 2020-07-08 DIAGNOSIS — B351 Tinea unguium: Secondary | ICD-10-CM | POA: Diagnosis not present

## 2020-07-08 HISTORY — DX: Dysphagia, unspecified: R13.10

## 2020-07-08 NOTE — Progress Notes (Signed)
This patient returns to my office for at risk foot care.  This patient requires this care by a professional since this patient will be at risk due to having  Diabetic neuropathy.  This patient is unable to cut nails himself since the patient cannot reach his nails.These nails are painful walking and wearing shoes.  This patient presents for at risk foot care today.  General Appearance  Alert, conversant and in no acute stress.  Vascular  Dorsalis pedis and posterior tibial  pulses are palpable  bilaterally.  Capillary return is within normal limits  bilaterally. Temperature is within normal limits  bilaterally.  Neurologic  Senn-Weinstein monofilament wire test within normal limits  bilaterally. Muscle power within normal limits bilaterally.  Nails Thick disfigured discolored nails with subungual debris  from hallux to fifth toes bilaterally. No evidence of bacterial infection or drainage bilaterally.  Orthopedic  No limitations of motion  feet .  No crepitus or effusions noted.  HAV  B/L.  Hammer toes  B/L.  Skin  normotropic skin with no porokeratosis noted bilaterally.  No signs of infections or ulcers noted.     Onychomycosis  Pain in right toes  Pain in left toes  Consent was obtained for treatment procedures.   Mechanical debridement of nails 1-5  bilaterally performed with a nail nipper.  Filed with dremel without incident.    Return office visit  12 weeks                    Told patient to return for periodic foot care and evaluation due to potential at risk complications.   Helane Gunther DPM

## 2020-09-08 ENCOUNTER — Other Ambulatory Visit: Payer: Self-pay | Admitting: Family Medicine

## 2020-09-08 DIAGNOSIS — E1169 Type 2 diabetes mellitus with other specified complication: Secondary | ICD-10-CM

## 2020-09-20 NOTE — Progress Notes (Signed)
Subjective:   Patient ID: Calvin Newton    DOB: 1951-04-13, 70 y.o. male   MRN: [id number]  Calvin Newton is a 70 y.o. male with a history of HTN, dysphagia, GERD, diabetic neuropathy, HLD, T2DM, chronic RLQ abdominal pain, enlarged prostate with elevated PSA here for chronic care management.  Right Knee Stiffness: Patient notes months of right knee stiffness primarily when he is walking down stairs and when he is lying down at night. Denies any erythema, warmth, swelling. Has a history of total left knee replacement. Was told he had significant arthritis in his right knee in the past. Denies any new trauma.  Diabetes: Last three A1C's below. Currently on Metformin 1000mg  BID and Trulicity 1.5mg  qweekly. Endorses compliance. History of intolerance to SGLT-2 due to penile irritation. Exercises daily. Denies any hypoglycemia. Denies any polyuria, polydipsia, polyphagia. Due for diabetic eye exam.  Lab Results  Component Value Date   HGBA1C 7.5 (A) 09/21/2020   HGBA1C 8.0 (A) 05/27/2020   HGBA1C 7.4 (A) 03/03/2020    HTN:  BP: 112/72 today. Currently on Lisinopril 2.5mg  qHS. Endorses compliance. Non-smoker. Denies any chest pain, SOB, vision changes, or headaches.   Lab Results  Component Value Date   CREATININE 1.25 04/27/2020   CREATININE 1.34 (H) 03/26/2020   CREATININE 1.25 03/18/2020   HLD: Last lipid panel below. Currently on Lipitor 40mg  QD. Endorses compliance. Denies any muscles aches or weakness.   Lab Results  Component Value Date   CHOL 105 09/21/2020   HDL 42 09/21/2020   LDLCALC 39 09/21/2020   LDLDIRECT 105 (H) 08/18/2014   TRIG 140 09/21/2020   CHOLHDL 2.5 09/21/2020   Health Maintenance: Health Maintenance Due  Topic  . OPHTHALMOLOGY EXAM    Review of Systems:  Per HPI.   Objective:   BP 112/72   Pulse (!) 107   Wt 266 lb 3.2 oz (120.7 kg)   SpO2 96%   BMI 37.13 kg/m  Vitals and nursing note reviewed.  General: pleasant older gentleman,  sitting comfortably in exam chair, well nourished, well developed, in no acute distress with non-toxic appearance Neck: supple, non-tender without lymphadenopathy CV: regular rate and rhythm without murmurs, rubs, or gallops, no lower extremity edema, 2+ radial and pedal pulses bilaterally Lungs: clear to auscultation bilaterally with normal work of breathing on room air, speaking in full sentences Abdomen: soft, non-tender, non-distended, normoactive bowel sounds Skin: warm, dry Extremities: warm and well perfused, normal tone MSK:  gait normal  Right Knee:   - Inspection: no gross deformity. No swelling/effusion, erythema or bruising. Skin intact   - Palpation: no TTP   - ROM: full active ROM with flexion and extension in knee and hip   - Strength: 5/5 strength   - Neuro/vasc: NV intact   - Special Tests:   - LIGAMENTS: negative anterior and posterior drawer, no MCL or LCL laxity    -- PF JOINT: nml patellar mobility bilaterally  Hips: normal ROM, negative FABER and FADIR bilaterally  Neuro: Alert and oriented, speech normal  Assessment & Plan:   Essential hypertension Chronic, well controlled. Nonsmoker. On statin. Low dose ACE-I for renal protection in setting of diabetes. - continue Lisinopril 2.5mg  qHS  Type 2 diabetes mellitus with other specified complication (HCC) Chronic, improving. A1C improved from 8.0 to 7.5. History of intolerance to SGLT-2. Non smoker. On low dose ACE for renal protection. On statin. - continue Metformin 1000mg  BID - continue Trulicity 1.5mg  q weekly - if worsening,  can consider increasing Trulicity to 3mg  qweekly - encouraged to follow up for diabetic eye exam  Hyperlipidemia associated with type 2 diabetes mellitus (HCC) Chronic, stable.  - continue Lipitor 40mg  QD  Right knee pain Chronic. History of severe tricompartmental degenerative disease in right knee. History of total left knee replacement. Bothers him primarily on stairs and at night.  No signs of acute infection.  - obtain right knee imaging given last imaging was in 2011 - discussed importance of strengthening exercises for best management of knee arthritis. Will refer to PT - recommended topical analgesics such as voltaren gel, Extra Strength Arthritis tylenol - recommended joint injection for acute relief. Patient to schedule at his convenience - will refer to ortho per patient request  Orders Placed This Encounter  Procedures  . DG Knee 4 Views W/Patella Right    Standing Status:   Future    Standing Expiration Date:   09/21/2021    Order Specific Question:   Reason for Exam (SYMPTOM  OR DIAGNOSIS REQUIRED)    Answer:   right knee pain, chronic    Order Specific Question:   Preferred imaging location?    Answer:   [facility]  . Lipid Panel  . Ambulatory referral to Physical Therapy    Referral Priority:   Routine    Referral Type:   Physical Medicine    Referral Reason:   Specialty Services Required    Requested Specialty:   Physical Therapy    Number of Visits Requested:   1  . Ambulatory referral to Orthopedics    Referral Priority:   Routine    Referral Type:   Consultation    Number of Visits Requested:   1  . POCT glycosylated hemoglobin (Hb A1C)   No orders of the defined types were placed in this encounter.    2012, DO PGY-3, [facility] 09/22/2020 1:45 PM

## 2020-09-21 ENCOUNTER — Ambulatory Visit (INDEPENDENT_AMBULATORY_CARE_PROVIDER_SITE_OTHER): Payer: Medicare Other | Admitting: Family Medicine

## 2020-09-21 ENCOUNTER — Other Ambulatory Visit: Payer: Self-pay

## 2020-09-21 ENCOUNTER — Encounter: Payer: Self-pay | Admitting: Family Medicine

## 2020-09-21 VITALS — BP 112/72 | HR 107 | Wt 266.2 lb

## 2020-09-21 DIAGNOSIS — E785 Hyperlipidemia, unspecified: Secondary | ICD-10-CM

## 2020-09-21 DIAGNOSIS — M25561 Pain in right knee: Secondary | ICD-10-CM

## 2020-09-21 DIAGNOSIS — E1169 Type 2 diabetes mellitus with other specified complication: Secondary | ICD-10-CM

## 2020-09-21 DIAGNOSIS — G8929 Other chronic pain: Secondary | ICD-10-CM | POA: Diagnosis not present

## 2020-09-21 DIAGNOSIS — I10 Essential (primary) hypertension: Secondary | ICD-10-CM | POA: Diagnosis not present

## 2020-09-21 DIAGNOSIS — M1711 Unilateral primary osteoarthritis, right knee: Secondary | ICD-10-CM

## 2020-09-21 LAB — POCT GLYCOSYLATED HEMOGLOBIN (HGB A1C): HbA1c, POC (controlled diabetic range): 7.5 % — AB (ref 0.0–7.0)

## 2020-09-21 NOTE — Patient Instructions (Signed)
It was a pleasure to see you today!  Thank you for choosing [facility] for your primary care.   Our plans for today were:  Continue current medications  Follow up in 3 months  I have placed referral to orthopedics for your right knee  Follow up on 10/07/20 for knee injection   To keep you healthy, please keep in mind the following health maintenance items that you are due for:   1. diabetic eye exam - have them fax over results to [phone] 2.   Best Wishes,   Orpah Cobb, DO

## 2020-09-22 ENCOUNTER — Encounter: Payer: Self-pay | Admitting: Family Medicine

## 2020-09-22 DIAGNOSIS — M25561 Pain in right knee: Secondary | ICD-10-CM | POA: Insufficient documentation

## 2020-09-22 DIAGNOSIS — M1711 Unilateral primary osteoarthritis, right knee: Secondary | ICD-10-CM | POA: Insufficient documentation

## 2020-09-22 LAB — LIPID PANEL
Chol/HDL Ratio: 2.5 ratio (ref 0.0–5.0)
Cholesterol, Total: 105 mg/dL (ref 100–199)
HDL: 42 mg/dL (ref >39)
LDL Chol Calc (NIH): 39 mg/dL (ref 0–99)
Triglycerides: 140 mg/dL (ref 0–149)
VLDL Cholesterol Cal: 24 mg/dL (ref 5–40)

## 2020-09-22 NOTE — Assessment & Plan Note (Addendum)
Chronic, improving. A1C improved from 8.0 to 7.5. History of intolerance to SGLT-2. Non smoker. On low dose ACE for renal protection. On statin. - continue Metformin 1000mg  BID - continue Trulicity 1.5mg  q weekly - if worsening, can consider increasing Trulicity to 3mg  qweekly - encouraged to follow up for diabetic eye exam

## 2020-09-22 NOTE — Assessment & Plan Note (Signed)
Chronic. History of severe tricompartmental degenerative disease in right knee. History of total left knee replacement. Bothers him primarily on stairs and at night. No signs of acute infection.  - obtain right knee imaging given last imaging was in 2011 - discussed importance of strengthening exercises for best management of knee arthritis. Will refer to PT - recommended topical analgesics such as voltaren gel, Extra Strength Arthritis tylenol - recommended joint injection for acute relief. Patient to schedule at his convenience - will refer to ortho per patient request

## 2020-09-22 NOTE — Assessment & Plan Note (Signed)
Chronic, stable.  - continue Lipitor 40mg  QD

## 2020-09-22 NOTE — Assessment & Plan Note (Signed)
Chronic, well controlled. Nonsmoker. On statin. Low dose ACE-I for renal protection in setting of diabetes. - continue Lisinopril 2.5mg  qHS

## 2020-09-23 ENCOUNTER — Other Ambulatory Visit: Payer: Self-pay | Admitting: Family Medicine

## 2020-09-23 ENCOUNTER — Ambulatory Visit
Admission: RE | Admit: 2020-09-23 | Discharge: 2020-09-23 | Disposition: A | Discharge status: Home / Self Care | Payer: Medicare Other | Source: Ambulatory Visit | Attending: Family Medicine | Admitting: Family Medicine

## 2020-09-23 DIAGNOSIS — M1711 Unilateral primary osteoarthritis, right knee: Secondary | ICD-10-CM

## 2020-09-23 DIAGNOSIS — M25561 Pain in right knee: Secondary | ICD-10-CM | POA: Diagnosis not present

## 2020-09-28 ENCOUNTER — Encounter: Payer: Self-pay | Admitting: Family Medicine

## 2020-10-04 NOTE — Patient Instructions (Signed)
You received a corticosteroid injection into your knee.  You may experience worsening pain in the first 24-48 hours but then the pain should improve. You can treat pain with Tylenol 500mg  every 6 hours as needed Capsaicin, aspercreme, or biofreeze topically up to four times a day may also help with pain. Some supplements that may help for arthritis: Boswellia extract, curcumin, pycnogenol Voltaren gel or pennsaid topically 4 times a day.  It's important that you continue to stay active.  Perform th following exercise: Straight leg raises, knee extensions 3 sets of 10 once a day (add ankle weight if these become too easy).  Consider physical therapy to strengthen muscles around the joint that hurts to take pressure off of the joint itself. Shoe inserts with good arch support may be helpful.  You can get these at good running stores like or [employer]. Heat or ice 15 minutes at a time 3-4 times a day as needed to help with pain. Water aerobics and cycling with low resistance are the best two types of exercise for arthritis though any exercise is ok as long as it doesn't worsen the pain.  Follow up if you develop redness, swelling, worsening pain after 48 hours, or fevers/chills.

## 2020-10-04 NOTE — Progress Notes (Signed)
   Subjective:   Patient ID: Calvin Newton    DOB: [birth year], 70 y.o. male   MRN: [id number]  Calvin Newton is a 70 y.o. male with a history of dysphagia, GERD, diabetic neuropathy, HLD, T2DM, osteoarthritis of knee, chronic RLQ abdominal pain, enlarged prostate, h/o HTN here for knee pain  Diabetes: Patient notes that he was on Glipizide in the past and he had lower blood sugars. He is very interested in doing a trial of Glipizide once a day. HE is currently on metformin and Trulicity.  History of intolerance to SGLT2.  Does endorse a history of hypoglycemia on glipizide when he was on twice a day dosing but never when he was on once a day dosing.  Right Knee Pain:  Patient returns today for right knee pain and steroid injection.  Denies any erythema, swelling, warmth, significant pain.  He notes that he worked out this morning without difficulty.  He is scheduled to see Ortho tomorrow.  Discussed deferring knee injection given Ortho appointment tomorrow however patient desires injection today.  Review of Systems:  Per HPI.   Objective:   BP 126/78   Pulse 92   Ht 5\' 11"  (1.803 m)   Wt 266 lb 3.2 oz (120.7 kg)   SpO2 98%   BMI 37.13 kg/m  Vitals and nursing note reviewed.  General: pleasant older male, sitting comfortably in exam chair, well nourished, well developed, in no acute distress with non-toxic appearance Resp: breathing comfortably on room air, speaking in full sentences Skin: warm, dry, no erythema or warmth of right knee Extremities: warm and well perfused, normal tone MSK:  gait normal, Right knee with minimal effusion, normal ROM Neuro: Alert and oriented, speech normal  Procedure performed: knee intraarticular corticosteroid injection; palpation guided Consent obtained and verified. Time-out conducted. Noted no overlying erythema, induration, or other signs of local infection. The right lateral joint space was palpated and marked. The overlying skin was  prepped in a sterile fashion. Topical analgesic spray: Ethyl chloride. Joint: left knee  Needle: 1.5cm 25G needle Completed without difficulty. Meds: methylprednisolone (Depo-Medrol, 80 mg per mL), 1 mL, mixed with 4 mL of 1% lidocaine   Assessment & Plan:   Osteoarthritis of right knee Right knee XR on 09/2020 notable for severe degenerative changes, progressive since 2011.  ICS injection into right knee performed without difficulty.  Continue conservative therapiest Discussed return precautions.  Follow up with ortho as scheduled  Type 2 diabetes mellitus with other specified complication (HCC) Chronic. With shared decision making we will do trial of glipizide 2.5 mg qAM.  Patient advised to monitor for hypoglycemia and to discontinue if blood sugar less than 60.  Continue metformin and Trulicity as prescribed.  Follow-up 3 months as scheduled.  No orders of the defined types were placed in this encounter.  Meds ordered this encounter  Medications  . glipiZIDE (GLUCOTROL) 5 MG tablet    Sig: Take 0.5 tablets (2.5 mg total) by mouth daily before breakfast.    Dispense:  90 tablet    Refill:  1  . methylPREDNISolone acetate (DEPO-MEDROL) injection 80 mg      2012, DO PGY-3, [facility] 10/07/2020 4:50 PM

## 2020-10-07 ENCOUNTER — Ambulatory Visit: Payer: Medicare Other | Admitting: Podiatry

## 2020-10-07 ENCOUNTER — Encounter: Payer: Self-pay | Admitting: Family Medicine

## 2020-10-07 ENCOUNTER — Ambulatory Visit (INDEPENDENT_AMBULATORY_CARE_PROVIDER_SITE_OTHER): Payer: Medicare Other | Admitting: Family Medicine

## 2020-10-07 ENCOUNTER — Other Ambulatory Visit: Payer: Self-pay

## 2020-10-07 VITALS — BP 126/78 | HR 92 | Ht 71.0 in | Wt 266.2 lb

## 2020-10-07 DIAGNOSIS — E1169 Type 2 diabetes mellitus with other specified complication: Secondary | ICD-10-CM | POA: Diagnosis not present

## 2020-10-07 DIAGNOSIS — M1711 Unilateral primary osteoarthritis, right knee: Secondary | ICD-10-CM

## 2020-10-07 MED ORDER — METHYLPREDNISOLONE ACETATE 80 MG/ML IJ SUSP
80.0000 mg | Freq: Once | INTRAMUSCULAR | Status: AC
  Administered 2020-10-07: 80 mg via INTRAMUSCULAR

## 2020-10-07 MED ORDER — GLIPIZIDE 5 MG PO TABS: 2.5000 mg | ORAL_TABLET | Freq: Every day | ORAL | 1 refills | Status: DC

## 2020-10-07 NOTE — Assessment & Plan Note (Signed)
Right knee XR on 09/2020 notable for severe degenerative changes, progressive since 2011.  ICS injection into right knee performed without difficulty.  Continue conservative therapiest Discussed return precautions.  Follow up with ortho as scheduled

## 2020-10-07 NOTE — Assessment & Plan Note (Signed)
Chronic. With shared decision making we will do trial of glipizide 2.5 mg qAM.  Patient advised to monitor for hypoglycemia and to discontinue if blood sugar less than 60.  Continue metformin and Trulicity as prescribed.  Follow-up 3 months as scheduled.

## 2020-10-08 DIAGNOSIS — M1711 Unilateral primary osteoarthritis, right knee: Secondary | ICD-10-CM | POA: Diagnosis not present

## 2020-10-19 ENCOUNTER — Encounter: Payer: Self-pay | Admitting: Podiatry

## 2020-10-19 ENCOUNTER — Ambulatory Visit: Payer: Medicare Other | Admitting: Podiatry

## 2020-10-19 ENCOUNTER — Other Ambulatory Visit: Payer: Self-pay

## 2020-10-19 DIAGNOSIS — B351 Tinea unguium: Secondary | ICD-10-CM

## 2020-10-19 DIAGNOSIS — M79675 Pain in left toe(s): Secondary | ICD-10-CM

## 2020-10-19 DIAGNOSIS — M79674 Pain in right toe(s): Secondary | ICD-10-CM

## 2020-10-19 DIAGNOSIS — E1142 Type 2 diabetes mellitus with diabetic polyneuropathy: Secondary | ICD-10-CM

## 2020-10-19 NOTE — Progress Notes (Signed)
This patient returns to my office for at risk foot care.  This patient requires this care by a professional since this patient will be at risk due to having  Diabetic neuropathy.  This patient is unable to cut nails himself since the patient cannot reach his nails.These nails are painful walking and wearing shoes.  This patient presents for at risk foot care today.  General Appearance  Alert, conversant and in no acute stress.  Vascular  Dorsalis pedis and posterior tibial  pulses are palpable  bilaterally.  Capillary return is within normal limits  bilaterally. Temperature is within normal limits  bilaterally.  Neurologic  Senn-Weinstein monofilament wire test within normal limits  bilaterally. Muscle power within normal limits bilaterally.  Nails Thick disfigured discolored nails with subungual debris  from hallux to fifth toes bilaterally. No evidence of bacterial infection or drainage bilaterally.  Orthopedic  No limitations of motion  feet .  No crepitus or effusions noted.  HAV  B/L.  Hammer toes  B/L.  Skin  normotropic skin with no porokeratosis noted bilaterally.  No signs of infections or ulcers noted.     Onychomycosis  Pain in right toes  Pain in left toes  Consent was obtained for treatment procedures.   Mechanical debridement of nails 1-5  bilaterally performed with a nail nipper.  Filed with dremel without incident.    Return office visit  10 weeks                    Told patient to return for periodic foot care and evaluation due to potential at risk complications.   Helane Gunther DPM

## 2020-11-12 DIAGNOSIS — M25511 Pain in right shoulder: Secondary | ICD-10-CM | POA: Diagnosis not present

## 2020-11-12 DIAGNOSIS — M25561 Pain in right knee: Secondary | ICD-10-CM | POA: Diagnosis not present

## 2020-11-29 NOTE — Progress Notes (Signed)
Subjective:   Patient ID: Calvin Newton    DOB: 03-09-1951, 70 y.o. male   MRN: [id number]  Calvin Newton is a 70 y.o. male with a history of GERD, dysphagia, T2DM, HLD, diabetic neuropathy, osteoarthritis of right knee, h/o HTN, enlarged prostate, abdominal pain, chronic here for diabetes follow up  Diabetes: Last three A1C's below. Currently on Metformin 1000mg  BID, Glipizide 2.5mg  QD (per patient request). Due to some confusion he had stopped the Trulicity 1.5mg  q weekly for about 4-5 weeks now. He has been restarting the Farxiga x 1 week without complications. Endorses compliance. Notes CBGs range 180's. Denies any hypoglycemia. Denies any polyuria, polydipsia, polyphagia.   Lab Results  Component Value Date   HGBA1C 8.4 (A) 12/02/2020   HGBA1C 7.5 (A) 09/21/2020   HGBA1C 8.0 (A) 05/27/2020    HLD: Last lipid panel below. Currently on Lipitor 40mg  QD. Endorses compliance. Denies any muscles aches or weakness. The ASCVD Risk score 05/29/2020 DC Jr., et al., 2013) failed to calculate for the following reasons:   The valid total cholesterol range is 130 to 320 mg/dL   Lab Results  Component Value Date   CHOL 105 09/21/2020   HDL 42 09/21/2020   LDLCALC 39 09/21/2020   LDLDIRECT 105 (H) 08/18/2014   TRIG 140 09/21/2020   CHOLHDL 2.5 09/21/2020    Review of Systems:  Per HPI.   Objective:   BP 123/77   Pulse 90   Ht 5\' 11"  (1.803 m)   Wt 256 lb (116.1 kg)   SpO2 95%   BMI 35.70 kg/m  Vitals and nursing note reviewed.  General: pleasant older male, sitting comfortably in exam chair, well nourished, well developed, in no acute distress with non-toxic appearance Resp: breathing comfortably on room air, speaking in full sentences Skin: warm, dry MSK: gait antalgic favoring right knee Neuro: Alert and oriented, speech normal  Assessment & Plan:   Type 2 diabetes mellitus with other specified complication (HCC) Chronic, uncontrolled. A1C worsened from 7.5 to 8.4.  Likely in setting of self-discontinuation of GLP-1 due to confusion. Has continued to lose weight and exercise. Reviewed nutrition and no red flags identified. He has self restarted 11/21/2020 and currently tolerating well. - continue Metformin 1000mg  BID - restart Trulicity 1.5mg  q weekly - restart Farxiga 10mg  QD - stop Glipizide - follow up 1 month to meet new PCP, monitor CBGs and evaluate for tolerance. Also ensure continued understanding of regimen as he appears to get easily confused.  - Can consider increase of Trulicity to 3mg  q weekly if still uncontrolled.   History of hypertension Elevated on initial reading but repeat measurement was normotensive. No changes to regimen. Continue Lisinopril 2.5mg  QD for kidney protection.  Hyperlipidemia associated with type 2 diabetes mellitus (HCC) Chronic, well controlled. Continue Atorvastatin 40mg  QD  Osteoarthritis of right knee Follows with orthopedics. Has appointment coming up to discuss surgery. Has significant improvement in knee pain with corticosteroid injection until recently after being too strenuous during exercise routine. Last ICS was April 2022. He would be eligible for repeat steroid shot in 1 month. He will follow up at that time if still problematic. Recommended OTC oral and topical analgesics PRN. Continue quad strengthening exercises and activity as tolerated. May benefit from knee brace for stability.   Orders Placed This Encounter  Procedures   POCT glycosylated hemoglobin (Hb A1C)   Meds ordered this encounter  Medications   Dulaglutide (TRULICITY) 1.5 MG/0.5ML SOPN    Sig: Inject  1.5 mg into the skin once a week.    Dispense:  4 mL    Refill:  3     Orpah Cobb, DO PGY-3, [facility] 12/02/2020 6:09 PM

## 2020-12-02 ENCOUNTER — Ambulatory Visit (INDEPENDENT_AMBULATORY_CARE_PROVIDER_SITE_OTHER): Payer: Medicare Other | Admitting: Family Medicine

## 2020-12-02 ENCOUNTER — Encounter: Payer: Self-pay | Admitting: Family Medicine

## 2020-12-02 ENCOUNTER — Other Ambulatory Visit: Payer: Self-pay

## 2020-12-02 VITALS — BP 123/77 | HR 90 | Ht 71.0 in | Wt 256.0 lb

## 2020-12-02 DIAGNOSIS — E785 Hyperlipidemia, unspecified: Secondary | ICD-10-CM | POA: Diagnosis not present

## 2020-12-02 DIAGNOSIS — E1169 Type 2 diabetes mellitus with other specified complication: Secondary | ICD-10-CM | POA: Diagnosis not present

## 2020-12-02 DIAGNOSIS — Z8679 Personal history of other diseases of the circulatory system: Secondary | ICD-10-CM | POA: Diagnosis not present

## 2020-12-02 DIAGNOSIS — M1711 Unilateral primary osteoarthritis, right knee: Secondary | ICD-10-CM | POA: Diagnosis not present

## 2020-12-02 LAB — POCT GLYCOSYLATED HEMOGLOBIN (HGB A1C): HbA1c, POC (controlled diabetic range): 8.4 % — AB (ref 0.0–7.0)

## 2020-12-02 MED ORDER — TRULICITY 1.5 MG/0.5ML ~~LOC~~ SOAJ
1.5000 mg | SUBCUTANEOUS | 3 refills | Status: DC
Start: 2020-12-02 — End: 2021-05-04

## 2020-12-02 NOTE — Patient Instructions (Addendum)
Diabetes: Continue Metformin 1000mg  twice a day, Trulicity 1.5mg  weekly, Farxiga.  Cholesterol: continue your Atorvastatin 40mg  daily  For your knee, you can come back in 1 month for another injection. I am so glad it helped the first time.  Please follow up in 1 month for diabetes follow up and to meet PCP

## 2020-12-02 NOTE — Assessment & Plan Note (Signed)
Elevated on initial reading but repeat measurement was normotensive. No changes to regimen. Continue Lisinopril 2.5mg  QD for kidney protection.

## 2020-12-02 NOTE — Assessment & Plan Note (Signed)
Follows with orthopedics. Has appointment coming up to discuss surgery. Has significant improvement in knee pain with corticosteroid injection until recently after being too strenuous during exercise routine. Last ICS was April 2022. He would be eligible for repeat steroid shot in 1 month. He will follow up at that time if still problematic. Recommended OTC oral and topical analgesics PRN. Continue quad strengthening exercises and activity as tolerated. May benefit from knee brace for stability.

## 2020-12-02 NOTE — Assessment & Plan Note (Signed)
Chronic, uncontrolled. A1C worsened from 7.5 to 8.4. Likely in setting of self-discontinuation of GLP-1 due to confusion. Has continued to lose weight and exercise. Reviewed nutrition and no red flags identified. He has self restarted [place] and currently tolerating well. - continue Metformin 1000mg  BID - restart Trulicity 1.5mg  q weekly - restart 10mg  QD - stop Glipizide - follow up 1 month to meet new PCP, monitor CBGs and evaluate for tolerance. Also ensure continued understanding of regimen as he appears to get easily confused.  - Can consider increase of Trulicity to 3mg  q weekly if still uncontrolled.

## 2020-12-02 NOTE — Assessment & Plan Note (Signed)
Chronic, well controlled. Continue Atorvastatin 40mg  QD

## 2020-12-03 DIAGNOSIS — M1711 Unilateral primary osteoarthritis, right knee: Secondary | ICD-10-CM | POA: Diagnosis not present

## 2020-12-11 ENCOUNTER — Other Ambulatory Visit: Payer: Self-pay | Admitting: Family Medicine

## 2020-12-11 DIAGNOSIS — E1169 Type 2 diabetes mellitus with other specified complication: Secondary | ICD-10-CM

## 2021-01-01 ENCOUNTER — Ambulatory Visit: Payer: Medicare Other | Admitting: Student

## 2021-01-04 ENCOUNTER — Ambulatory Visit (INDEPENDENT_AMBULATORY_CARE_PROVIDER_SITE_OTHER): Payer: Medicare Other | Admitting: Student

## 2021-01-04 ENCOUNTER — Other Ambulatory Visit: Payer: Self-pay

## 2021-01-04 ENCOUNTER — Encounter: Payer: Self-pay | Admitting: Student

## 2021-01-04 ENCOUNTER — Ambulatory Visit: Payer: Medicare Other | Admitting: Podiatry

## 2021-01-04 DIAGNOSIS — E1169 Type 2 diabetes mellitus with other specified complication: Secondary | ICD-10-CM

## 2021-01-04 NOTE — Patient Instructions (Signed)
It was great to see you! Thank you for allowing me to participate in your care!  I recommend that you always bring your medications to each appointment as this makes it easy to ensure we are on the correct medications and helps [place] not miss when refills are needed.  Our plans for today:  - Medications: - continue Metformin 1000mg  BID - continue Trulicity 1.5mg  q weekly - continue Farxiga 10mg  QD - follow up 2 month to meet new PCP, to test A1c    Take care and seek immediate care sooner if you develop any concerns.   Dr. 06-17-2002, MD [facility]

## 2021-01-04 NOTE — Progress Notes (Signed)
    SUBJECTIVE:   CHIEF COMPLAINT / HPI:   DM: Here to check his A1c so he can get his knee surgery in future. Checks blood sugar at home daily. Usually around 130-150, works out in the morning and appreciates that it drops his blood sugar. Appreciates that the medicine is making his BG lower. Trulicity once a week, Scherrie Gerlach every other day, metformin twice a day. No complaints of fatigue, or low blood sugar. No headaches, chest pain. Has gained a little weight but does use easy stepper to exercise 5-6 days a week for about 1hr and 40 min. Diet is good and balanced with greens, beans and vegetables. No changes in bowel habits or urination.   In June he received a steroid injection in his knee and shoulder 2 weeks apart. He believes it raised his A1C in June.   PERTINENT  PMH / PSH: GERD, dysphagia, T2DM, HLD, diabetic neuropathy, osteoarthritis of right knee, h/o HTN, enlarged prostate  OBJECTIVE:   Vitals:   01/04/21 1106  BP: 116/64  Pulse: 89  SpO2: 96%   Physical Exam: Heart: normal S1/s2, no RMG Lungs: CTABL no stridor or wheezing Abdomen: Soft non-tender, small abdominal hernia appreciated on exam, non-pulsating.   ASSESSMENT/PLAN:   No problem-specific Assessment & Plan notes found for this encounter.  Diabetes: Had his A1C checked last month and has been using medications intermittently, 2/2 concern for price. Discussed medication regimen and timing for next check A1c check.  Medications: - continue Metformin 1000mg  BID - continue Trulicity 1.5mg  q weekly - continue Farxiga 10mg  QD - Can consider increase of Trulicity to 3mg  q weekly if still uncontrolled.  Follow up A1c Check in 2 months   06-17-2002, MD [facility]

## 2021-01-04 NOTE — Assessment & Plan Note (Signed)
Had his A1C checked last month and has been using medications intermittently, 2/2 concern for price. Discussed medication regimen and timing for next check A1c check.  Medications: - continue Metformin 1000mg  BID - continue Trulicity 1.5mg  q weekly - continue Farxiga 10mg  QD - Can consider increase of Trulicity to 3mg  q weekly if still uncontrolled. Follow up A1c Check in 2 months

## 2021-01-05 ENCOUNTER — Encounter: Payer: Self-pay | Admitting: Podiatry

## 2021-01-05 ENCOUNTER — Ambulatory Visit: Payer: Medicare Other | Admitting: Podiatry

## 2021-01-05 DIAGNOSIS — M79674 Pain in right toe(s): Secondary | ICD-10-CM | POA: Diagnosis not present

## 2021-01-05 DIAGNOSIS — E1142 Type 2 diabetes mellitus with diabetic polyneuropathy: Secondary | ICD-10-CM | POA: Diagnosis not present

## 2021-01-05 DIAGNOSIS — M79675 Pain in left toe(s): Secondary | ICD-10-CM | POA: Diagnosis not present

## 2021-01-05 DIAGNOSIS — B351 Tinea unguium: Secondary | ICD-10-CM | POA: Diagnosis not present

## 2021-01-05 NOTE — Progress Notes (Signed)
This patient returns to my office for at risk foot care.  This patient requires this care by a professional since this patient will be at risk due to having  Diabetic neuropathy.  This patient is unable to cut nails himself since the patient cannot reach his nails.These nails are painful walking and wearing shoes.  This patient presents for at risk foot care today.  General Appearance  Alert, conversant and in no acute stress.  Vascular  Dorsalis pedis and posterior tibial  pulses are palpable  bilaterally.  Capillary return is within normal limits  bilaterally. Temperature is within normal limits  bilaterally.  Neurologic  Senn-Weinstein monofilament wire test within normal limits  bilaterally. Muscle power within normal limits bilaterally.  Nails Thick disfigured discolored nails with subungual debris  from hallux to fifth toes bilaterally. No evidence of bacterial infection or drainage bilaterally.  Orthopedic  No limitations of motion  feet .  No crepitus or effusions noted.  HAV  B/L.  Hammer toes  B/L.  Skin  normotropic skin with no porokeratosis noted bilaterally.  No signs of infections or ulcers noted.     Onychomycosis  Pain in right toes  Pain in left toes  Consent was obtained for treatment procedures.   Mechanical debridement of nails 1-5  bilaterally performed with a nail nipper.  Filed with dremel without incident.    Return office visit  10 weeks                    Told patient to return for periodic foot care and evaluation due to potential at risk complications.   Emalina Dubreuil DPM  

## 2021-01-06 ENCOUNTER — Telehealth: Payer: Self-pay

## 2021-01-06 NOTE — Telephone Encounter (Signed)
LEFT VOICEMAIL FOR PT REGARDING POSSIBLE ASSISTANCE FOR MEDICATION.  CALL BACK #[phone]

## 2021-01-15 NOTE — Telephone Encounter (Signed)
RETURNED CALL ABOUT PT ASSITANCE FOR TRULICITY. PT WILL TRY TO COME ONE DAY NEXT WEEK TO SIGN APP AND PROVIDE PROOF OF INCOME

## 2021-01-28 ENCOUNTER — Other Ambulatory Visit: Payer: Self-pay | Admitting: *Deleted

## 2021-01-28 DIAGNOSIS — I10 Essential (primary) hypertension: Secondary | ICD-10-CM

## 2021-01-28 DIAGNOSIS — E119 Type 2 diabetes mellitus without complications: Secondary | ICD-10-CM

## 2021-01-29 MED ORDER — METFORMIN HCL 1000 MG PO TABS: ORAL_TABLET | ORAL | 2 refills | Status: DC

## 2021-01-29 MED ORDER — LISINOPRIL 2.5 MG PO TABS: 2.5000 mg | ORAL_TABLET | Freq: Every day | ORAL | 3 refills | Status: DC

## 2021-02-09 DIAGNOSIS — M1711 Unilateral primary osteoarthritis, right knee: Secondary | ICD-10-CM | POA: Diagnosis not present

## 2021-03-09 ENCOUNTER — Other Ambulatory Visit: Payer: Self-pay

## 2021-03-09 DIAGNOSIS — E785 Hyperlipidemia, unspecified: Secondary | ICD-10-CM

## 2021-03-09 DIAGNOSIS — E1169 Type 2 diabetes mellitus with other specified complication: Secondary | ICD-10-CM

## 2021-03-09 MED ORDER — ATORVASTATIN CALCIUM 40 MG PO TABS
40.0000 mg | ORAL_TABLET | Freq: Every day | ORAL | 0 refills | Status: DC
Start: 2021-03-09 — End: 2021-12-28

## 2021-03-16 ENCOUNTER — Ambulatory Visit: Payer: Medicare Other | Admitting: Podiatry

## 2021-04-23 ENCOUNTER — Ambulatory Visit: Payer: Medicare Other | Admitting: Podiatry

## 2021-04-23 ENCOUNTER — Other Ambulatory Visit: Payer: Self-pay

## 2021-04-23 ENCOUNTER — Ambulatory Visit (INDEPENDENT_AMBULATORY_CARE_PROVIDER_SITE_OTHER): Payer: Medicare Other | Admitting: Family Medicine

## 2021-04-23 VITALS — BP 114/83 | HR 104 | Ht 71.0 in | Wt 254.2 lb

## 2021-04-23 DIAGNOSIS — E1169 Type 2 diabetes mellitus with other specified complication: Secondary | ICD-10-CM

## 2021-04-23 DIAGNOSIS — Z23 Encounter for immunization: Secondary | ICD-10-CM

## 2021-04-23 DIAGNOSIS — E1142 Type 2 diabetes mellitus with diabetic polyneuropathy: Secondary | ICD-10-CM | POA: Diagnosis not present

## 2021-04-23 LAB — POCT GLYCOSYLATED HEMOGLOBIN (HGB A1C): HbA1c, POC (controlled diabetic range): 7.2 % — AB (ref 0.0–7.0)

## 2021-04-23 NOTE — Progress Notes (Signed)
    SUBJECTIVE:   CHIEF COMPLAINT / HPI:   Diabetes, Type 2 - Last A1c 8.4 on 12/02/2020 - Medications: Metformin 1000mg  BID, Trulicity 1.5mg  weekly,  - Compliance: Good overall - Checking BG at home: About 1-2 times per day average of fasting 150s. - Diet: overall well balanced, does eat ice cream every night - Exercise: 30 minutes 5-6 days per week. Push-ups and leg exercises. - Eye exam: Not done this year - Microalbumin: previously documented - Statin: Atorvastatin  40mg  - Denies symptoms of hypoglycemia, polyuria, polydipsia, numbness extremities, foot ulcers/trauma  PERTINENT  PMH / PSH: Reviewed   OBJECTIVE:   BP 114/83   Pulse (!) 104   Ht 5\' 11"  (1.803 m)   Wt 254 lb 3.2 oz (115.3 kg)   SpO2 99%   BMI 35.45 kg/m   Gen: well-appearing, NAD CV: RRR, no m/r/g appreciated, no peripheral edema Pulm: CTAB, no wheezes/crackles Diabetic Foot Exam - Simple   Simple Foot Form Diabetic Foot exam was performed with the following findings: Yes 04/23/2021  2:25 PM  Visual Inspection No deformities, no ulcerations, no other skin breakdown bilaterally: Yes Sensation Testing Intact to touch and monofilament testing bilaterally: Yes Pulse Check Posterior Tibialis and Dorsalis pulse intact bilaterally: Yes Comments No hair present on either foot.     ASSESSMENT/PLAN:   Type 2 diabetes mellitus with other specified complication (HCC) A1c today 7.2, improved from 8.4.  Currently on atorvastatin 40 mg, metformin 2000 g twice daily, Trulicity 1.5 mg weekly.  Will not change medications at this time, believe that patient is able to make dietary changes to get within goal (<7).  Discussed dietary habits with eliminating at nighttime ice cream.  Consider nutrition referral if patient agreeable. -Continue current regimen -Dietary changes discussed -Foot exam completed -Discussed with patient to get ophthalmology exam -Follow-up in 3 months   Healthcare maintenance Foot exam  completed today Flu shot given Discussed completing ophthalmology exam  , DO [facility]

## 2021-04-23 NOTE — Patient Instructions (Addendum)
Your A1c today was 7.2, which is improved. I do not want to make any medication changes today, I do think that you can get your A1c within goal (<7) with dietary changes.  I recommend not eating ice cream every night and substituting this with something else such as a low sugar yogurt or another low sugar dessert if you need a sweet treat at the end of the day.  If you are ever interested we do have a nutritionist that we can send you to for referral.  One of the recommendations for diabetics is to have an exam with ophthalmologist (eye doctor), I recommend calling your insurance and seeing who is in network in the area.

## 2021-04-23 NOTE — Assessment & Plan Note (Addendum)
A1c today 7.2, improved from 8.4.  Currently on atorvastatin 40 mg, metformin 2000 g twice daily, Trulicity 1.5 mg weekly.  Will not change medications at this time, believe that patient is able to make dietary changes to get within goal (<7).  Discussed dietary habits with eliminating at nighttime ice cream.  Consider nutrition referral if patient agreeable. -Continue current regimen -Dietary changes discussed -Foot exam completed -Discussed with patient to get ophthalmology exam -Follow-up in 3 months

## 2021-04-30 ENCOUNTER — Other Ambulatory Visit: Payer: Self-pay | Admitting: Family Medicine

## 2021-04-30 DIAGNOSIS — E1169 Type 2 diabetes mellitus with other specified complication: Secondary | ICD-10-CM

## 2021-06-04 ENCOUNTER — Ambulatory Visit (INDEPENDENT_AMBULATORY_CARE_PROVIDER_SITE_OTHER): Payer: Medicare Other | Admitting: Podiatry

## 2021-06-04 DIAGNOSIS — M79674 Pain in right toe(s): Secondary | ICD-10-CM

## 2021-06-04 DIAGNOSIS — E1142 Type 2 diabetes mellitus with diabetic polyneuropathy: Secondary | ICD-10-CM | POA: Diagnosis not present

## 2021-06-04 DIAGNOSIS — B351 Tinea unguium: Secondary | ICD-10-CM | POA: Diagnosis not present

## 2021-06-04 DIAGNOSIS — M79675 Pain in left toe(s): Secondary | ICD-10-CM

## 2021-06-05 ENCOUNTER — Encounter: Payer: Self-pay | Admitting: Podiatry

## 2021-06-05 NOTE — Progress Notes (Signed)
This patient returns to my office for at risk foot care.  This patient requires this care by a professional since this patient will be at risk due to having  Diabetic neuropathy.  This patient is unable to cut nails himself since the patient cannot reach his nails.These nails are painful walking and wearing shoes.  This patient presents for at risk foot care today. Patient was treated during office blackout.  General Appearance  Alert, conversant and in no acute stress.  Vascular  Dorsalis pedis and posterior tibial  pulses are palpable  bilaterally.  Capillary return is within normal limits  bilaterally. Temperature is within normal limits  bilaterally.  Neurologic  Senn-Weinstein monofilament wire test within normal limits  bilaterally. Muscle power within normal limits bilaterally.  Nails Thick disfigured discolored nails with subungual debris  from hallux to fifth toes bilaterally. No evidence of bacterial infection or drainage bilaterally.  Orthopedic  No limitations of motion  feet .  No crepitus or effusions noted.  HAV  B/L.  Hammer toes  B/L.  Skin  normotropic skin with no porokeratosis noted bilaterally.  No signs of infections or ulcers noted.     Onychomycosis  Pain in right toes  Pain in left toes  Consent was obtained for treatment procedures.   Mechanical debridement of nails 1-5  bilaterally performed with a nail nipper.  Filed with dremel without incident.    Return office visit  12 weeks                    Told patient to return for periodic foot care and evaluation due to potential at risk complications.   Comfort Iversen DPM  

## 2021-08-13 ENCOUNTER — Ambulatory Visit: Payer: Medicare Other | Admitting: Student

## 2021-08-17 ENCOUNTER — Ambulatory Visit: Payer: Medicare Other

## 2021-08-23 ENCOUNTER — Encounter: Payer: Self-pay | Admitting: Student

## 2021-08-23 ENCOUNTER — Other Ambulatory Visit: Payer: Self-pay

## 2021-08-23 ENCOUNTER — Ambulatory Visit (INDEPENDENT_AMBULATORY_CARE_PROVIDER_SITE_OTHER): Payer: Medicare Other | Admitting: Student

## 2021-08-23 VITALS — BP 102/62 | HR 68 | Wt 251.4 lb

## 2021-08-23 DIAGNOSIS — E1169 Type 2 diabetes mellitus with other specified complication: Secondary | ICD-10-CM | POA: Diagnosis not present

## 2021-08-23 DIAGNOSIS — E785 Hyperlipidemia, unspecified: Secondary | ICD-10-CM | POA: Diagnosis not present

## 2021-08-23 DIAGNOSIS — Z8679 Personal history of other diseases of the circulatory system: Secondary | ICD-10-CM

## 2021-08-23 LAB — POCT GLYCOSYLATED HEMOGLOBIN (HGB A1C): HbA1c, POC (controlled diabetic range): 7.1 % — AB (ref 0.0–7.0)

## 2021-08-23 NOTE — Progress Notes (Signed)
?SUBJECTIVE:  ? ?CHIEF COMPLAINT / HPI:  ? ?Follow up DM ? ?DM: ?Patient last 04/23/21 for DM check, A1c 7.2. Patient wanting to change diet to see if A1c will drop to goal below 7% ?Lab Results  ?Component Value Date  ? HGBA1C 7.1 (A) 08/23/2021  ?Medications: Metformin 1000mg  BID, Trulicity 1.5mg  weekly ?Compliance: Yes ?Symptoms: Denies hypoglycemia, polyuria, polydipsia, numbness/tingling in finger/toes, foot ulcers ?Exercise: working out 50 min 4-6 days a week, lost 2-3 pounds ?Diet: Eating less ice cream, drinking a quart and half of water a day.  ? ?HLD ?Lab Results  ?Component Value Date  ? CHOL 105 09/21/2020  ? HDL 42 09/21/2020  ? LDLCALC 39 09/21/2020  ? LDLDIRECT 105 (H) 08/18/2014  ? TRIG 140 09/21/2020  ? CHOLHDL 2.5 09/21/2020  ?Meds: Atorvastatin 40 mg ?Compliance: Good ?Symptoms: myalgias ?-Rec lipid profile ? ?History of HTN ?Patient with good BP and no concern for HTN. Patient on lisinopril for renal protection, given presence of protein in urine, with history of DM. ?BP Readings from Last 3 Encounters:  ?08/23/21 102/62  ?04/23/21 114/83  ?01/04/21 116/64  ?BP today: 102/62 ?Meds: lisinopril 2.5 mg  ?Compliance: Good ?Symptoms: Headache, changes in vision, chest pain, SOB, dizziness, light-headedness (1 every 3 months, when getting up from sitting down.) ?-Rec BMP ? ?Optho Exam, patient due for eye exam, will place referral ? ?Shingrix - patient not wanting vaccine ? ? ?PERTINENT  PMH / PSH: HTN, HLD ? ?Past Medical History:  ?Diagnosis Date  ? Abdominal pain, chronic, right lower quadrant 05/28/2013  ? Chronic persistent, stable. Remains unclear etiology, seems functional with improvement after dietary changes, considered IBS - Chronic >30 yrs, Hx negative colonoscopy  Referred back to GI (Dr. 05/30/2013 - [facility]) - Last seen 09/08/14 - dx chronic RLQ abd pain, dysphagia, GERD. Reproducible pain on deep RLQ abd pain with radiation up to chest, thought to be due to MSK. Ordered  Abd   ? Dysphagia 07/08/2020  ? Enlarged prostate on rectal examination 06/27/2019  ? History of hypertension 07/26/2016  ? Osteoarthritis   ? T2DM (type 2 diabetes mellitus) (HCC) 04/2010  ? ? ?Past Surgical History:  ?Procedure Laterality Date  ? TOTAL KNEE ARTHROPLASTY    ? Left  ? ? ?OBJECTIVE:  ?BP 102/62   Pulse 68   Wt 251 lb 6.4 oz (114 kg)   SpO2 98%   BMI 35.06 kg/m?  ? ?General: NAD, pleasant, able to participate in exam ?Cardiac: RRR, no murmurs auscultated. ?Respiratory: CTAB, normal effort, no wheezes, rales or rhonchi ?Abdomen: soft, non-tender, non-distended, normoactive bowel sounds ?Extremities: warm and well perfused, no edema or cyanosis. ?Skin: warm and dry, no rashes noted ?Neuro: alert, no obvious focal deficits, speech normal ?Psych: Normal affect and mood ? ?ASSESSMENT/PLAN:  ?Type 2 diabetes mellitus with other specified complication (HCC) ?Patient with improved A1c today of 7.1, from 7.2. Patient has been exercising more, and been more aware of sweets. Consoled patient on diet/increasing vegetables and limiting carbs. Will continue to monitor DM with A1c checks. Patient nearly at goal of < 7, although 7-8 reasonable given age. Will also check Vitamin B12 level because of metformin use.   ?-Continue diet changes, recheck A1c in 3 months ?-Continue home meds ?-Continue lisinopril for renal protection, will d/c for hypotension ?-Amb referral to Optho for eye exam ?-Check BMP for lisinopril use, and to evaluate Cr ? ?Hyperlipidemia associated with type 2 diabetes mellitus (HCC) ?Patient last lipid panel was a  year ago. Will check him today. Patient nearly at goal of less than 100 for LDL. Will adjust statin as necessary.  ?-Lipid panel ?-Continue atorvastatin 40 mg ?  ?Orders Placed This Encounter  ?Procedures  ? Lipid Panel  ? Basic Metabolic Panel  ? Vitamin B12  ? Ambulatory referral to Ophthalmology  ?  Referral Priority:   Routine  ?  Referral Type:   Consultation  ?  Referral Reason:    Specialty Services Required  ?  Requested Specialty:   Ophthalmology  ?  Number of Visits Requested:   1  ? HgB A1c  ? ?No orders of the defined types were placed in this encounter. ? ?No follow-ups on file. ?@SIGNNOTE @ ? ?

## 2021-08-23 NOTE — Patient Instructions (Signed)
It was great to see you! Thank you for allowing me to participate in your care! ? ?Our plans for today:  ?- Check B12 level ?- Continue lisinopril, stop if symptoms of light-headedness or dizziness ?- Hgb A1c 7.1 lower from before, continue diet changes (more vegetables) and exercise ?- BMP to check kidney function and electrolyte levels ?- Cholesterol panel ? ?We are checking some labs today, I will call you if they are abnormal will send you a MyChart message or a letter if they are normal.  If you do not hear about your labs in the next 2 weeks please let us know. ? ?Take care and seek immediate care sooner if you develop any concerns.  ? ?Dr. Bess Kinds, MD ?[facility] ? ?

## 2021-08-23 NOTE — Assessment & Plan Note (Signed)
Patient last lipid panel was a year ago. Will check him today. Patient nearly at goal of less than 100 for LDL. Will adjust statin as necessary.  ?-Lipid panel ?-Continue atorvastatin 40 mg ?

## 2021-08-23 NOTE — Assessment & Plan Note (Addendum)
Patient with improved A1c today of 7.1, from 7.2. Patient has been exercising more, and been more aware of sweets. Consoled patient on diet/increasing vegetables and limiting carbs. Will continue to monitor DM with A1c checks. Patient nearly at goal of < 7, although 7-8 reasonable given age. Will also check Vitamin B12 level because of metformin use.   ?-Continue diet changes, recheck A1c in 3 months ?-Continue home meds ?-Continue lisinopril for renal protection, will d/c for hypotension ?-Amb referral to Optho for eye exam ?-Check BMP for lisinopril use, and to evaluate Cr ?

## 2021-08-24 LAB — BASIC METABOLIC PANEL
BUN/Creatinine Ratio: 11 (ref 10–24)
BUN: 11 mg/dL (ref 8–27)
CO2: 23 mmol/L (ref 20–29)
Calcium: 10.3 mg/dL — ABNORMAL HIGH (ref 8.6–10.2)
Chloride: 97 mmol/L (ref 96–106)
Creatinine, Ser: 1.03 mg/dL (ref 0.76–1.27)
Glucose: 129 mg/dL — ABNORMAL HIGH (ref 70–99)
Potassium: 5.1 mmol/L (ref 3.5–5.2)
Sodium: 139 mmol/L (ref 134–144)
eGFR: 79 mL/min/{1.73_m2} (ref >59)

## 2021-08-24 LAB — LIPID PANEL
Chol/HDL Ratio: 3.8 ratio (ref 0.0–5.0)
Cholesterol, Total: 192 mg/dL (ref 100–199)
HDL: 50 mg/dL (ref >39)
LDL Chol Calc (NIH): 107 mg/dL — ABNORMAL HIGH (ref 0–99)
Triglycerides: 203 mg/dL — ABNORMAL HIGH (ref 0–149)
VLDL Cholesterol Cal: 35 mg/dL (ref 5–40)

## 2021-08-24 LAB — VITAMIN B12: Vitamin B-12: 607 pg/mL (ref 232–1245)

## 2021-08-31 ENCOUNTER — Ambulatory Visit: Payer: Medicare Other

## 2021-09-22 ENCOUNTER — Encounter: Payer: Self-pay | Admitting: Podiatry

## 2021-09-22 ENCOUNTER — Ambulatory Visit: Payer: Medicare Other | Admitting: Podiatry

## 2021-09-22 DIAGNOSIS — E1142 Type 2 diabetes mellitus with diabetic polyneuropathy: Secondary | ICD-10-CM | POA: Diagnosis not present

## 2021-09-22 DIAGNOSIS — B351 Tinea unguium: Secondary | ICD-10-CM | POA: Diagnosis not present

## 2021-09-22 DIAGNOSIS — M79675 Pain in left toe(s): Secondary | ICD-10-CM | POA: Diagnosis not present

## 2021-09-22 DIAGNOSIS — M79674 Pain in right toe(s): Secondary | ICD-10-CM

## 2021-10-02 NOTE — Progress Notes (Signed)
?  Subjective:  ?Patient ID: Calvin Newton, male    DOB: [birth year],  MRN: [id number] ? ?Calvin Newton presents to clinic today for at risk foot care with history of diabetic neuropathy and painful elongated mycotic toenails 1-5 bilaterally which are tender when wearing enclosed shoe gear. Pain is relieved with periodic professional debridement. ? ?Patient states blood glucose was 140 mg/dl today.   ? ?New problem(s): None.  ? ?PCP is Bess Kinds, MD , and last visit was March 2023.. ? ?No Known Allergies ? ?Review of Systems: Negative except as noted in the HPI. ?Objective:  ? ?Constitutional Calvin Newton is a pleasant 71 y.o. African American male, obese in NAD. AAO x 3.   ?Vascular CFT <3 seconds b/l LE. Palpable DP/PT pulses b/l LE. Digital hair sparse b/l. Skin temperature gradient WNL b/l. No pain with calf compression b/l. No edema noted b/l. No cyanosis or clubbing noted b/l LE.  ?Neurologic Normal speech. Oriented to person, place, and time. Pt has subjective symptoms of neuropathy. Protective sensation intact 5/5 intact bilaterally with 10g monofilament b/l.  ?Dermatologic Pedal integument with normal turgor, texture and tone b/l LE. No open wounds b/l. No interdigital macerations b/l. Toenails 1-5 b/l elongated, thickened, discolored with subungual debris. +Tenderness with dorsal palpation of nailplates. No hyperkeratotic or porokeratotic lesions present.  ?Orthopedic: Normal muscle strength 5/5 to all lower extremity muscle groups bilaterally. Hallux valgus with bunion deformity noted b/l lower extremities. Hammertoe deformity noted 2-5 b/l.Marland Kitchen No pain, crepitus or joint limitation noted with ROM b/l LE.  Patient ambulates independently without assistive aids.  ? ?Radiographs: None ? ?Last A1c:  ? ?  Latest Ref Rng & Units 08/23/2021  ? 11:31 AM 04/23/2021  ?  1:58 PM 12/02/2020  ?  4:17 PM  ?Hemoglobin A1C  ?Hemoglobin-A1c 0.0 - 7.0 % 7.1   7.2   8.4    ? ?  ?Assessment:  ? ?1. Pain due to  onychomycosis of toenails of both feet   ?2. Diabetic polyneuropathy associated with type 2 diabetes mellitus (HCC)   ? ?Plan:  ?Patient was evaluated and treated and all questions answered. ?Consent given for treatment as described below: ?-Patient was evaluated and treated. All patient's and/or POA's questions/concerns answered on today's visit. ?-Continue foot and shoe inspections daily. Monitor blood glucose per PCP/Endocrinologist's recommendations. ?-Mycotic toenails 1-5 bilaterally were debrided in length and girth with sterile nail nippers and dremel without incident. ?-Patient/POA to call should there be question/concern in the interim. ? ?Return in about 3 months (around 12/22/2021). ? ?Freddie Breech, DPM ?

## 2021-10-14 ENCOUNTER — Ambulatory Visit: Payer: Medicare Other

## 2021-10-15 ENCOUNTER — Ambulatory Visit: Payer: Medicare Other

## 2021-10-28 ENCOUNTER — Ambulatory Visit (INDEPENDENT_AMBULATORY_CARE_PROVIDER_SITE_OTHER): Payer: Medicare Other

## 2021-10-28 DIAGNOSIS — Z0000 Encounter for general adult medical examination without abnormal findings: Secondary | ICD-10-CM | POA: Diagnosis not present

## 2021-10-30 ENCOUNTER — Other Ambulatory Visit: Payer: Self-pay | Admitting: Student

## 2021-11-05 NOTE — Progress Notes (Addendum)
Subjective:   Calvin Newton is a 71 y.o. male who presents for Medicare Annual/Subsequent preventive examination.  The patient consented to a virtual visit. Patient consented to have virtual visit and was identified by name and date of birth. Method of visit: Telephone  Encounter participants: Patient: Calvin Newton - located at Home Nurse/Provider: Dorna Bloom - located at [facility] Others (if applicable): NA  Review of Systems: Defer to PCP   Cardiac Risk Factors include: advanced age (>52men, >75 women);diabetes mellitus;hypertension;male gender;dyslipidemia  Objective:    Vitals: There were no vitals taken for this visit.  There is no height or weight on file to calculate BMI.     11/05/2021   12:56 PM 08/23/2021   11:35 AM 04/23/2021    2:02 PM 12/02/2020    4:20 PM 09/21/2020    4:34 PM 08/23/2019    3:17 PM 03/20/2019    3:16 PM  Advanced Directives  Does Patient Have a Medical Advance Directive? No No No No No No No  Would patient like information on creating a medical advance directive? Yes (MAU/Ambulatory/Procedural Areas - Information given) No - Patient declined No - Patient declined No - Patient declined No - Patient declined No - Patient declined No - Patient declined   Tobacco Social History   Tobacco Use  Smoking Status Never   Passive exposure: Never  Smokeless Tobacco Never     Clinical Intake:  Pre-visit preparation completed: Yes  Diabetes: Yes  How often do you need to have someone help you when you read instructions, pamphlets, or other written materials from your doctor or pharmacy?: 2 - Rarely What is the last grade level you completed in school?: High School  Past Medical History:  Diagnosis Date   Abdominal pain, chronic, right lower quadrant 05/28/2013   Chronic persistent, stable. Remains unclear etiology, seems functional with improvement after dietary changes, considered IBS - Chronic >30 yrs, Hx negative colonoscopy  Referred back to  GI (Dr. Carol Ada - [facility]) - Last seen 09/08/14 - dx chronic RLQ abd pain, dysphagia, GERD. Reproducible pain on deep RLQ abd pain with radiation up to chest, thought to be due to MSK. Ordered Abd    Dysphagia 07/08/2020   Enlarged prostate on rectal examination 06/27/2019   History of hypertension 07/26/2016   Osteoarthritis    T2DM (type 2 diabetes mellitus) ([place]) 04/2010   Past Surgical History:  Procedure Laterality Date   TOTAL KNEE ARTHROPLASTY     Left   Family History  Problem Relation Age of Onset   Colon cancer Neg Hx    Esophageal cancer Neg Hx    Stomach cancer Neg Hx    Pancreatic cancer Neg Hx    Social History   Socioeconomic History   Marital status: Single    Spouse name: Not on file   Number of children: 0   Years of education: 12   Highest education level: High school graduate  Occupational History    Employer: OTHER  Tobacco Use   Smoking status: Never    Passive exposure: Never   Smokeless tobacco: Never  Vaping Use   Vaping Use: Never used  Substance and Sexual Activity   Alcohol use: No   Drug use: No   Sexual activity: Not Currently  Other Topics Concern   Not on file  Social History Narrative   Patient lives alone.   Calvin Newton married and has no children.    Patient works on a Development worker, community  truck 6 days per week.    Patient exercises for 60 minutes 6x per week.    Cardio and strength training.    Very active in church.    Social Determinants of Health   Financial Resource Strain: Low Risk    Difficulty of Paying Living Expenses: Not hard at all  Food Insecurity: No Food Insecurity   Worried About Charity fundraiser in the Last Year: Never true   [place] in the Last Year: Never true  Transportation Needs: No Transportation Needs   Lack of Transportation (Medical): No   Lack of Transportation (Non-Medical): No  Physical Activity: Sufficiently Active   Days of Exercise per Week: 6 days   Minutes of Exercise per Session: 60 min   Stress: No Stress Concern Present   Feeling of Stress : Only a little  Social Connections: Moderately Integrated   Frequency of Communication with Friends and Family: More than three times a week   Frequency of Social Gatherings with Friends and Family: More than three times a week   Attends Religious Services: More than 4 times per year   Active Member of Clubs or Organizations: Yes   Attends Archivist Meetings: More than 4 times per year   Marital Status: Never married   Outpatient Encounter Medications as of 10/28/2021  Medication Sig   aspirin 81 MG EC tablet Take 81 mg by mouth daily.   atorvastatin (LIPITOR) 40 MG tablet Take 1 tablet (40 mg total) by mouth daily.   hydroxypropyl methylcellulose / hypromellose (ISOPTO TEARS / GONIOVISC) 2.5 % ophthalmic solution Place 1 drop into both eyes as needed for dry eyes.   lisinopril (ZESTRIL) 2.5 MG tablet Take 1 tablet (2.5 mg total) by mouth at bedtime.   metFORMIN (GLUCOPHAGE) 1000 MG tablet TAKE 1 TABLET BY MOUTH  TWICE DAILY WITH MEALS   Multiple Vitamins-Minerals (ICAPS AREDS 2 PO) Take 1 capsule by mouth daily.    TRULICITY 1.5 0000000 SOPN INJECT THE CONTENTS OF ONE  PEN SUBCUTANEOUSLY WEEKLY  AS DIRECTED   Multiple Vitamins-Minerals (PRESERVISION AREDS 2+MULTI VIT PO) Take 1 capsule by mouth daily.   omeprazole (PRILOSEC) 20 MG capsule 1 cap(s) (Patient not taking: Reported on 04/23/2021)   [DISCONTINUED] metFORMIN (GLUCOPHAGE) 1000 MG tablet TAKE 1 TABLET BY MOUTH TWICE DAILY WITH A MEAL   No facility-administered encounter medications on file as of 10/28/2021.   Activities of Daily Living    11/05/2021   12:57 PM  In your present state of health, do you have any difficulty performing the following activities:  Hearing? 0  Vision? 0  Difficulty concentrating or making decisions? 0  Walking or climbing stairs? 0  Dressing or bathing? 0  Doing errands, shopping? 0  Preparing Food and eating ? N  Using the  Toilet? N  In the past six months, have you accidently leaked urine? N  Do you have problems with loss of bowel control? N  Managing your Medications? N  Managing your Finances? N  Housekeeping or managing your Housekeeping? N   Patient Care Team: Holley Bouche, MD as PCP - General (Family Medicine)     Assessment:   This is a routine wellness examination for [place].  Exercise Activities and Dietary recommendations Current Exercise Habits: Home exercise routine, Type of exercise: strength training/weights;walking, Time (Minutes): 60, Frequency (Times/Week): 7, Weekly Exercise (Minutes/Week): 420, Intensity: Moderate, Exercise limited by: cardiac condition(s)   Goals      HEMOGLOBIN A1C < 6.5  Fall Risk    11/05/2021   12:57 PM 08/23/2021   11:35 AM 04/23/2021    2:01 PM 09/21/2020    4:34 PM 05/27/2020    4:29 PM  Fall Risk   Falls in the past year? 1 0 0 0 0  Number falls in past yr: 0 0 0 0 0  Injury with Fall? 0 0 0 0   Risk for fall due to : No Fall Risks      Risk for fall due to: Comment Not paying attention- no fall concerns      Follow up Falls prevention discussed    Falls evaluation completed   Patient reports normal gait and balance. Patient does not use assistive devices to ambulate.  Is the patient's home free of loose throw rugs in walkways, pet beds, electrical cords, etc?   yes      Grab bars in the bathroom? yes      Handrails on the stairs?   yes      Adequate lighting?   yes  Patient rating of health (0-10): 8   Depression Screen    11/05/2021   12:56 PM 11/05/2021   12:55 PM 08/23/2021   11:35 AM 04/23/2021    2:01 PM  PHQ 2/9 Scores  PHQ - 2 Score 0 0 0 0  PHQ- 9 Score 1  1 0   Cognitive Function    11/05/2021   12:58 PM  6CIT Screen  What Year? 0 points  What month? 0 points  What time? 0 points  Count back from 20 0 points  Months in reverse 0 points  Repeat phrase 0 points  Total Score 0 points   Immunization History   Administered Date(s) Administered   Fluad Quad(high Dose 65+) 03/03/2020, 04/23/2021   Influenza,inj,Quad PF,6+ Mos 02/11/2013, 03/19/2014, 03/20/2019   PFIZER(Purple Top)SARS-COV-2 Vaccination 08/02/2019, 08/27/2019, 04/06/2020   Pneumococcal Conjugate-13 08/31/2015   Pneumococcal Polysaccharide-23 11/22/2018   Tdap 03/22/2011, 01/13/2018   Qualifies for Shingles Vaccine? Yes   Shingrix Completed: No, Education has been provided regarding the importance of this vaccine. Advised may receive this vaccine at local pharmacy or Health Dept. Aware to provide a copy of the vaccination record if obtained from local pharmacy or Health Dept. Verbalized acceptance and understanding.   Screening Tests Health Maintenance  Topic Date Due   Zoster Vaccines- Shingrix (1 of 2) Never done   COVID-19 Vaccine (4 - Booster for Pfizer series) 06/01/2020   OPHTHALMOLOGY EXAM  04/06/2021   INFLUENZA VACCINE  01/11/2022   HEMOGLOBIN A1C  02/23/2022   FOOT EXAM  04/23/2022   TETANUS/TDAP  01/14/2028   COLONOSCOPY (Pts 45-42yrs Insurance coverage will need to be confirmed)  04/30/2028   Pneumonia Vaccine 12+ Years old  Completed   Hepatitis C Screening  Completed   HPV VACCINES  Aged Out   Cancer Screenings: Lung: Low Dose CT Chest recommended if Age 36-80 years, 30 pack-year currently smoking OR have quit w/in 15years. Patient does not qualify. Colorectal: UTD  Additional Screenings: Hepatitis C Screening: Completed  HIV Screening: Completed    Plan:  PCP apt scheduled for 6/22. You are due for shingles vaccine- you can get this at your local pharmacy.  You are eligible for bivalent covid booster- you can get this here at [facility].  I have personally reviewed and noted the following in the patient's chart:   Medical and social history Use of alcohol, tobacco or illicit drugs  Current medications and supplements Functional ability  and status Nutritional status Physical activity Advanced  directives List of other physicians Hospitalizations, surgeries, and ER visits in previous 12 months Vitals Screenings to include cognitive, depression, and falls Referrals and appointments  In addition, I have reviewed and discussed with patient certain preventive protocols, quality metrics, and best practice recommendations. A written personalized care plan for preventive services as well as general preventive health recommendations were provided to patient.  This visit was conducted virtually in the setting of the [place] pandemic.   Dorna Bloom, CMA  11/05/2021   I have reviewed this visit and agree with the documentation.  Yehuda Savannah MD

## 2021-11-05 NOTE — Patient Instructions (Signed)
Calvin Newton Thank you for taking time to come for your Medicare Wellness Visit. I appreciate your ongoing commitment to your health goals. Please review the following plan we discussed and let me know if I can assist you in the future.    These are the goals we discussed:   Goals      HEMOGLOBIN A1C < 6.5       We also discussed recommended health maintenance. As discussed, you are due for: Health Maintenance  Topic Date Due   Zoster Vaccines- Shingrix (1 of 2) Never done   COVID-19 Vaccine (4 - Booster for Pfizer series) 06/01/2020   OPHTHALMOLOGY EXAM  04/06/2021   INFLUENZA VACCINE  01/11/2022   HEMOGLOBIN A1C  02/23/2022   FOOT EXAM  04/23/2022   TETANUS/TDAP  01/14/2028   COLONOSCOPY (Pts 45-63yrs Insurance coverage will need to be confirmed)  04/30/2028   Pneumonia Vaccine 63+ Years old  Completed   Hepatitis C Screening  Completed   HPV VACCINES  Aged Out   PCP apt scheduled for 6/22. You are due for shingles vaccine- you can get this at your local pharmacy.  You are eligible for bivalent covid booster- you can get this here at [facility].  Preventive Care 68 Years and Older, Male Preventive care refers to lifestyle choices and visits with your health care provider that can promote health and wellness. Preventive care visits are also called wellness exams. What can I expect for my preventive care visit? Counseling During your preventive care visit, your health care provider may ask about your: Medical history, including: Past medical problems. Family medical history. History of falls. Current health, including: Emotional well-being. Home life and relationship well-being. Sexual activity. Memory and ability to understand (cognition). Lifestyle, including: Alcohol, nicotine or tobacco, and drug use. Access to firearms. Diet, exercise, and sleep habits. Work and work Astronomer. Sunscreen use. Safety issues such as seatbelt and bike helmet use. Physical exam Your health  care provider will check your: Height and weight. These may be used to calculate your BMI (body mass index). BMI is a measurement that tells if you are at a healthy weight. Waist circumference. This measures the distance around your waistline. This measurement also tells if you are at a healthy weight and may help predict your risk of certain diseases, such as type 2 diabetes and high blood pressure. Heart rate and blood pressure. Body temperature. Skin for abnormal spots. What immunizations do I need?  Vaccines are usually given at various ages, according to a schedule. Your health care provider will recommend vaccines for you based on your age, medical history, and lifestyle or other factors, such as travel or where you work. What tests do I need? Screening Your health care provider may recommend screening tests for certain conditions. This may include: Lipid and cholesterol levels. Diabetes screening. This is done by checking your blood sugar (glucose) after you have not eaten for a while (fasting). Hepatitis C test. Hepatitis B test. HIV (human immunodeficiency virus) test. STI (sexually transmitted infection) testing, if you are at risk. Lung cancer screening. Colorectal cancer screening. Prostate cancer screening. Abdominal aortic aneurysm (AAA) screening. You may need this if you are a current or former smoker. Talk with your health care provider about your test results, treatment options, and if necessary, the need for more tests. Follow these instructions at home: Eating and drinking  Eat a diet that includes fresh fruits and vegetables, whole grains, lean protein, and low-fat dairy products. Limit your intake of  foods with high amounts of sugar, saturated fats, and salt. Take vitamin and mineral supplements as recommended by your health care provider. Do not drink alcohol if your health care provider tells you not to drink. If you drink alcohol: Limit how much you have to 0-2  drinks a day. Know how much alcohol is in your drink. In the [place] one drink equals one 12 oz bottle of beer (355 mL), one 5 oz glass of wine (148 mL), or one 1 oz glass of hard liquor (44 mL). Lifestyle Brush your teeth every morning and night with fluoride toothpaste. Floss one time each day. Exercise for at least 30 minutes 5 or more days each week. Do not use any products that contain nicotine or tobacco. These products include cigarettes, chewing tobacco, and vaping devices, such as e-cigarettes. If you need help quitting, ask your health care provider. Do not use drugs. If you are sexually active, practice safe sex. Use a condom or other form of protection to prevent STIs. Take aspirin only as told by your health care provider. Make sure that you understand how much to take and what form to take. Work with your health care provider to find out whether it is safe and beneficial for you to take aspirin daily. Ask your health care provider if you need to take a cholesterol-lowering medicine (statin). Find healthy ways to manage stress, such as: Meditation, yoga, or listening to music. Journaling. Talking to a trusted person. Spending time with friends and family. Safety Always wear your seat belt while driving or riding in a vehicle. Do not drive: If you have been drinking alcohol. Do not ride with someone who has been drinking. When you are tired or distracted. While texting. If you have been using any mind-altering substances or drugs. Wear a helmet and other protective equipment during sports activities. If you have firearms in your house, make sure you follow all gun safety procedures. Minimize exposure to UV radiation to reduce your risk of skin cancer. What's next? Visit your health care provider once a year for an annual wellness visit. Ask your health care provider how often you should have your eyes and teeth checked. Stay up to date on all vaccines. This information is not  intended to replace advice given to you by your health care provider. Make sure you discuss any questions you have with your health care provider. Document Revised: 11/25/2020 Document Reviewed: 11/25/2020 Elsevier Patient Education  2023 Elsevier Inc.   Our clinic's number is [phone]. Please call with questions or concerns about what we discussed today.

## 2021-11-17 DIAGNOSIS — E119 Type 2 diabetes mellitus without complications: Secondary | ICD-10-CM | POA: Diagnosis not present

## 2021-11-17 DIAGNOSIS — H40033 Anatomical narrow angle, bilateral: Secondary | ICD-10-CM | POA: Diagnosis not present

## 2021-11-17 LAB — HM DIABETES EYE EXAM

## 2021-11-29 DIAGNOSIS — H43393 Other vitreous opacities, bilateral: Secondary | ICD-10-CM | POA: Diagnosis not present

## 2021-12-01 NOTE — Progress Notes (Unsigned)
  SUBJECTIVE:   CHIEF COMPLAINT / HPI:   Diabetes management Patient attempting to achieve better control of A1c with diet and exercise.  Last A1c:  Lab Results  Component Value Date   HGBA1C 7.1 (A) 08/23/2021  Meds: Metformin 1000 mg BID, Trulicity 1.5 mg wk Compliance: Symptoms:   PERTINENT  PMH / PSH: DM, HTN   OBJECTIVE:  There were no vitals taken for this visit.  General: NAD, pleasant, able to participate in exam Cardiac: RRR, no murmurs auscultated. Respiratory: CTAB, normal effort, no wheezes, rales or rhonchi Abdomen: soft, non-tender, non-distended, normoactive bowel sounds Extremities: warm and well perfused, no edema or cyanosis. Skin: warm and dry, no rashes noted Neuro: alert, no obvious focal deficits, speech normal Psych: Normal affect and mood  ASSESSMENT/PLAN:  No problem-specific Assessment & Plan notes found for this encounter.   No orders of the defined types were placed in this encounter.  No orders of the defined types were placed in this encounter.  No follow-ups on file. @SIGNNOTE @ {    This will disappear when note is signed, click to select method of visit    :1}

## 2021-12-01 NOTE — Patient Instructions (Signed)
It was great to see you! Thank you for allowing me to participate in your care!  I recommend that you always bring your medications to each appointment as this makes it easy to ensure we are on the correct medications and helps [place] not miss when refills are needed.  Our plans for today:  - Diabetes: Hgb A1c 6.9 today! Keep up the good work! Our goal was less than 7! - High Blood Pressure: BP 126/60, looks great! Our goal is less than 130/80  Keep up the good work!  Shoulder pain Google "Shoulder pain exercises" Youtube: Type "youtube" into google.    Search "Shoulder pain exercises" in youtube search bar   Watch short videos for suggestions    Knee pain Google "Knee pain exercises"  Look for quick information/exercises Youtube: Type "youtube" into google.    Search "Knee pain exercises", in search bar   Watch short videos for suggestions   Take care and seek immediate care sooner if you develop any concerns.   Dr. Bess Kinds, MD [facility]

## 2021-12-01 NOTE — Progress Notes (Signed)
[facility])                                            [facility] Quality Pharmacy Team                                        Statin Quality Measure Assessment    12/01/2021  [name] [birth year] [id number]  Per review of chart and payor information, this patient has been flagged for non-adherence to the following CMS Quality Measure:   [x]  Statin Use in Persons with Diabetes  []  Statin Use in Persons with Cardiovascular Disease  The 10-year ASCVD risk score (Arnett DK, et al., 2019) is: 21.9%   Values used to calculate the score:     Age: 71 years     Sex: Male     Is Non-Hispanic African American: Yes     Diabetic: Yes     Tobacco smoker: No     Systolic Blood Pressure: 102 mmHg     Is BP treated: Yes     HDL Cholesterol: 50 mg/dL     Total Cholesterol: 192 mg/dL  Atorvastatin 40 mg daily on file and has not been filled since 03/18/2021. H/o statin induced myalgia. Last LDL 107 (as of 08/23/2021) and ASCVD risk 21.9%. Next appt with PCP on 12/02/2021. If clinically appropriate, please consider assessing atorvastatin use. If patient has a statin intolerance, please consider associating an exclusion code (see options below) at the next office visit.    Please consider ONE of the following recommendations:   Initiate high intensity statin Atorvastatin 40mg  once daily, #90, 3 refills   Rosuvastatin 20mg  once daily, #90, 3 refills    Initiate moderate intensity          statin with reduced frequency if prior          statin intolerance 1x weekly, #13, 3 refills   2x weekly, #26, 3 refills   3x weekly, #39, 3 refills   Code for past statin intolerance or other exclusions (required annually)  Drug Induced Myopathy G72.0   Myositis, unspecified M60.9   Rhabdomyolysis M62.82   Cirrhosis of liver K74.69   Biliary cirrhosis, unspecified K74.5   Abnormal blood glucose - for SUPD ONLY R73.09   Prediabetes - for SUPD ONLY  R73.03    Thank you for your time,  08/25/2021, PharmD Clinical Pharmacist [facility] Cell: [phone]

## 2021-12-02 ENCOUNTER — Encounter: Payer: Self-pay | Admitting: Student

## 2021-12-02 ENCOUNTER — Ambulatory Visit (INDEPENDENT_AMBULATORY_CARE_PROVIDER_SITE_OTHER): Payer: Medicare Other | Admitting: Student

## 2021-12-02 VITALS — BP 126/60 | HR 84 | Wt 249.0 lb

## 2021-12-02 DIAGNOSIS — E1169 Type 2 diabetes mellitus with other specified complication: Secondary | ICD-10-CM

## 2021-12-02 DIAGNOSIS — M1711 Unilateral primary osteoarthritis, right knee: Secondary | ICD-10-CM

## 2021-12-02 LAB — POCT GLYCOSYLATED HEMOGLOBIN (HGB A1C): HbA1c, POC (controlled diabetic range): 6.9 % (ref 0.0–7.0)

## 2021-12-02 NOTE — Assessment & Plan Note (Addendum)
Hgb A1c today 6.9, patient is at goal. Reports changing diet and exercise have helped. Is compliant with home meds, with CBG range 140-160's. Will continue to monitor and continue home meds -Continue metformin 1000 mg BID, Trulicity 1.5 mg weekly -Continue CBG checks

## 2021-12-02 NOTE — Assessment & Plan Note (Signed)
Patient complains of intermittent knee pain, with hx of OA. 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 Patient looking for exercises to help with knee pain. Encourage patient to find exercises on youtube/google -Demonstrated how to search for exercises on youtube -Directions to search on google/youtube given

## 2021-12-28 ENCOUNTER — Other Ambulatory Visit: Payer: Self-pay | Admitting: Student

## 2021-12-28 MED ORDER — ROSUVASTATIN CALCIUM 10 MG PO TABS: 10.0000 mg | ORAL_TABLET | Freq: Every day | ORAL | 0 refills | Status: DC

## 2021-12-29 ENCOUNTER — Ambulatory Visit: Payer: Medicare Other | Admitting: Podiatry

## 2021-12-31 ENCOUNTER — Encounter: Payer: Self-pay | Admitting: Podiatry

## 2021-12-31 ENCOUNTER — Ambulatory Visit: Payer: Medicare Other | Admitting: Podiatry

## 2021-12-31 DIAGNOSIS — M79675 Pain in left toe(s): Secondary | ICD-10-CM | POA: Diagnosis not present

## 2021-12-31 DIAGNOSIS — M79674 Pain in right toe(s): Secondary | ICD-10-CM

## 2021-12-31 DIAGNOSIS — B351 Tinea unguium: Secondary | ICD-10-CM | POA: Diagnosis not present

## 2021-12-31 DIAGNOSIS — E1142 Type 2 diabetes mellitus with diabetic polyneuropathy: Secondary | ICD-10-CM

## 2021-12-31 NOTE — Progress Notes (Signed)
This patient returns to my office for at risk foot care.  This patient requires this care by a professional since this patient will be at risk due to having  Diabetic neuropathy.  This patient is unable to cut nails himself since the patient cannot reach his nails.These nails are painful walking and wearing shoes.  This patient presents for at risk foot care today. Patient was treated during office blackout.  General Appearance  Alert, conversant and in no acute stress.  Vascular  Dorsalis pedis and posterior tibial  pulses are palpable  bilaterally.  Capillary return is within normal limits  bilaterally. Temperature is within normal limits  bilaterally.  Neurologic  Senn-Weinstein monofilament wire test within normal limits  bilaterally. Muscle power within normal limits bilaterally.  Nails Thick disfigured discolored nails with subungual debris  from hallux to fifth toes bilaterally. No evidence of bacterial infection or drainage bilaterally.  Orthopedic  No limitations of motion  feet .  No crepitus or effusions noted.  HAV  B/L.  Hammer toes  B/L.  Skin  normotropic skin with no porokeratosis noted bilaterally.  No signs of infections or ulcers noted.     Onychomycosis  Pain in right toes  Pain in left toes  Consent was obtained for treatment procedures.   Mechanical debridement of nails 1-5  bilaterally performed with a nail nipper.  Filed with dremel without incident.    Return office visit  12 weeks                    Told patient to return for periodic foot care and evaluation due to potential at risk complications.   Helane Gunther DPM

## 2022-01-07 ENCOUNTER — Other Ambulatory Visit: Payer: Self-pay | Admitting: Student

## 2022-01-07 DIAGNOSIS — I10 Essential (primary) hypertension: Secondary | ICD-10-CM

## 2022-01-07 DIAGNOSIS — E119 Type 2 diabetes mellitus without complications: Secondary | ICD-10-CM

## 2022-01-28 ENCOUNTER — Telehealth: Payer: Self-pay

## 2022-01-28 NOTE — Telephone Encounter (Signed)
Patient calls nurse line to inform PCP that he has stopped taking lisinopril and rosuvastatin. He reports that he was experiencing severe itchiness and irritation all over his body. Patient was unsure which medication was causing this reaction, so he stopped taking both about one week ago. Since then, he reports improvement in his symptoms.   Patient denies HA, blurry vision, chest pain or SHOB.   Return precautions discussed.   FYI to PCP.   Veronda Prude, RN

## 2022-03-08 ENCOUNTER — Telehealth: Payer: Self-pay | Admitting: Student

## 2022-03-08 NOTE — Telephone Encounter (Signed)
Called to discuss restarting lisinopril and Crestor for patient. Patient had stopped meds because it was causing him to itch. Patient reports itching has now improved. Told patient I was unfamiliar with itching being a side effect but we should stop meds if causing itching. Encouraged patient to restart lisinopril and see if he has itching.   Told patient if his BP is 130/80 or lower, for a week, I would stop the lisinopril. But told patient it's important he's on medication for his BP and cholesterol.   Told patient to make appointment in 1-2 months to evaluate need for BP and cholesterol med. Will likely start him on new meds at this time. Patient to try lisinopril again, if itching, we will switch to different BP medicine.

## 2022-04-15 ENCOUNTER — Other Ambulatory Visit: Payer: Self-pay | Admitting: Student

## 2022-04-15 DIAGNOSIS — E1169 Type 2 diabetes mellitus with other specified complication: Secondary | ICD-10-CM

## 2022-04-29 ENCOUNTER — Telehealth: Payer: Self-pay

## 2022-04-29 NOTE — Patient Outreach (Signed)
  Care Coordination   04/29/2022 Name: Calvin Newton MRN: [id number] DOB: August 09, 1950   Care Coordination Outreach Attempts:  An unsuccessful telephone outreach was attempted today to offer the patient information about available care coordination services as a benefit of their health plan.   Follow Up Plan:  Additional outreach attempts will be made to offer the patient care coordination information and services.   Encounter Outcome:  No Answer  Care Coordination Interventions Activated:  No   Care Coordination Interventions:  No, not indicated    Jodelle Gross, RN, BSN, CCM Care Management Coordinator [facility] Phone: [phone]/Fax: [phone]

## 2022-05-12 NOTE — Patient Instructions (Addendum)
It was great to see you! Thank you for allowing me to participate in your care!  Your diabetes is doing well. We don't need to change anything around. We are adjusting your blood pressure and cholesterol medicine.  Our plans for today:  - Blood pressure  Your blood pressure was right at the level for where we need to treat it.   Start losartan 25 mg daily  Follow up in 2 weeks for blood pressure check  - Cholesterol  We need to restart you on a cholesterol medicine.  Start atorvostatin 20 mg daily  Call me if experiencing any issues  Will recheck lipids in 3-6 months (just come in for lab visit)  -Diabetes  Your diabetes is at goal, with an A1c of 6.9. We want it around/less than 7.  No changes to meds  Make appointment to be seen in 1 year   Take care and seek immediate care sooner if you develop any concerns.   Dr. Bess Kinds, MD [facility]

## 2022-05-12 NOTE — Progress Notes (Unsigned)
SUBJECTIVE:   CHIEF COMPLAINT / HPI:   F/u DM and Statin use  DM Meds: Metformin 1000 mg BID, Trulicity 1.5 wkly Lab Results  Component Value Date   HGBA1C 6.9 05/13/2022  Urine: Ordered Optho:Ordered Foot Exam: performed  Hasn't been taking lisinopril med 2/2 to itching. Patient wanting to switch meds.    HLD / Statin Use Patient had stopped lisinopril and Crestor as he was having itching the improved with d/c of meds. I encouraged patient to restart lisinopril and to return for f/u on cholesterol meds. Patient was on crestor, and had previous been switched from atorvastatin for non-compliance.   PERTINENT  PMH / PSH:    OBJECTIVE:  BP 139/76   Pulse 82   Wt 254 lb 12.8 oz (115.6 kg)   SpO2 98%   BMI 35.54 kg/m  Physical Exam Constitutional:      Appearance: Normal appearance.  Cardiovascular:     Rate and Rhythm: Normal rate.     Pulses: Normal pulses.     Heart sounds: Normal heart sounds. No murmur heard.    No friction rub. No gallop.  Pulmonary:     Effort: Pulmonary effort is normal. No respiratory distress.     Breath sounds: Normal breath sounds. No stridor. No wheezing, rhonchi or rales.  Musculoskeletal:     Right foot: No deformity.     Left foot: No deformity.  Feet:     Right foot:     Protective Sensation: 10 sites tested.  7 sites sensed.     Skin integrity: Skin integrity normal.     Toenail Condition: Right toenails are abnormally thick.     Left foot:     Protective Sensation: 10 sites tested.  6 sites sensed.     Skin integrity: Skin integrity normal.     Toenail Condition: Left toenails are abnormally thick.  Neurological:     Mental Status: He is alert.      ASSESSMENT/PLAN:  Type 2 diabetes mellitus with other specified complication, without long-term current use of insulin (HCC) Assessment & Plan: Lab Results  Component Value Date   HGBA1C 6.9 05/13/2022  A1c at goal today, patient ok for f/u in 1 year -Continue: Metformin  1000 mg BID, Trulicity 1.5 mg wkly -Urine albumin ordered -Patient reports he had eye exam this year, ask to send/bring results -D/c lisinopril (concern caused itching) and start losartan for renal protection    Orders: -     Microalbumin / creatinine urine ratio -     Ambulatory referral to Ophthalmology -     POCT glycosylated hemoglobin (Hb A1C) -     Lipid panel; Future  Hyperlipidemia associated with type 2 diabetes mellitus (HCC) Assessment & Plan: Patient not currently using stating, because he believed it caused itching. Patient was previously on atorvastatin, but had stopped taking it, unsure why. Will restart atorvastatin, I do not believe patient had issue of statin intolerance, but was just non-compliant. Will monitor for issues of statin compliance.  -Atorvastatin 20 mg -Check lipid profile at f/u -F/u 3-6 months -Patient to call with issues/reaction to med   Other orders -     Losartan Potassium; Take 1 tablet (25 mg total) by mouth at bedtime.  Dispense: 90 tablet; Refill: 3 -     Atorvastatin Calcium; Take 1 tablet (20 mg total) by mouth daily.  Dispense: 30 tablet; Refill: 3   No follow-ups on file. Bess Kinds, MD 05/16/2022, 12:09 PM PGY-2, [facility]

## 2022-05-13 ENCOUNTER — Ambulatory Visit (INDEPENDENT_AMBULATORY_CARE_PROVIDER_SITE_OTHER): Payer: Managed Care, Other (non HMO) | Admitting: Student

## 2022-05-13 ENCOUNTER — Encounter: Payer: Self-pay | Admitting: Student

## 2022-05-13 ENCOUNTER — Other Ambulatory Visit: Payer: Self-pay

## 2022-05-13 VITALS — BP 139/76 | HR 82 | Wt 254.8 lb

## 2022-05-13 DIAGNOSIS — E785 Hyperlipidemia, unspecified: Secondary | ICD-10-CM | POA: Diagnosis not present

## 2022-05-13 DIAGNOSIS — E1169 Type 2 diabetes mellitus with other specified complication: Secondary | ICD-10-CM | POA: Diagnosis not present

## 2022-05-13 LAB — POCT GLYCOSYLATED HEMOGLOBIN (HGB A1C): HbA1c, POC (controlled diabetic range): 6.9 % (ref 0.0–7.0)

## 2022-05-13 MED ORDER — ATORVASTATIN CALCIUM 20 MG PO TABS: 20.0000 mg | ORAL_TABLET | Freq: Every day | ORAL | 3 refills | Status: DC

## 2022-05-13 MED ORDER — LOSARTAN POTASSIUM 25 MG PO TABS: 25.0000 mg | ORAL_TABLET | Freq: Every day | ORAL | 3 refills | Status: DC

## 2022-05-16 LAB — MICROALBUMIN / CREATININE URINE RATIO
Creatinine, Urine: 50.9 mg/dL
Microalb/Creat Ratio: <6 mg/g creat (ref 0–29)
Microalbumin, Urine: <3 ug/mL

## 2022-05-16 NOTE — Assessment & Plan Note (Addendum)
Lab Results  Component Value Date   HGBA1C 6.9 05/13/2022  A1c at goal today, patient ok for f/u in 1 year -Continue: Metformin 1000 mg BID, Trulicity 1.5 mg wkly -Urine albumin ordered -Patient reports he had eye exam this year, ask to send/bring results -D/c lisinopril (concern caused itching) and start losartan for renal protection

## 2022-05-16 NOTE — Assessment & Plan Note (Addendum)
Patient not currently using stating, because he believed it caused itching. Patient was previously on atorvastatin, but had stopped taking it, unsure why. Will restart atorvastatin, I do not believe patient had issue of statin intolerance, but was just non-compliant. Will monitor for issues of statin compliance.  -Atorvastatin 20 mg -Check lipid profile at f/u -F/u 3-6 months -Patient to call with issues/reaction to med

## 2022-05-17 ENCOUNTER — Encounter: Payer: Self-pay | Admitting: Student

## 2022-06-20 ENCOUNTER — Telehealth: Payer: Self-pay

## 2022-06-20 NOTE — Patient Outreach (Signed)
  Care Coordination   06/20/2022 Name: QUANTAY ZAREMBA MRN: [id number] DOB: Jul 20, 1950   Care Coordination Outreach Attempts:  A second unsuccessful outreach was attempted today to offer the patient with information about available care coordination services as a benefit of their health plan.     Follow Up Plan:  Additional outreach attempts will be made to offer the patient care coordination information and services.   Encounter Outcome:  No Answer   Care Coordination Interventions:  No, not indicated    Jone Baseman, RN, MSN [place] [facility] Care Management Coordinator Direct Line [phone]

## 2022-07-05 ENCOUNTER — Telehealth: Payer: Self-pay

## 2022-07-05 NOTE — Patient Outreach (Signed)
  Care Coordination   07/05/2022 Name: Calvin Newton MRN: [id number] DOB: 20-Oct-1950   Care Coordination Outreach Attempts:  A third unsuccessful outreach was attempted today to offer the patient with information about available care coordination services as a benefit of their health plan.   Follow Up Plan:  No further outreach attempts will be made at this time. We have been unable to contact the patient to offer or enroll patient in care coordination services  Encounter Outcome:  No Answer   Care Coordination Interventions:  No, not indicated    Jone Baseman, RN, MSN [place] [facility] Care Management Coordinator Direct Line [phone]

## 2022-07-14 ENCOUNTER — Other Ambulatory Visit: Payer: Self-pay | Admitting: Student

## 2022-08-01 ENCOUNTER — Other Ambulatory Visit: Payer: Medicare Other

## 2022-08-04 ENCOUNTER — Other Ambulatory Visit: Payer: Self-pay

## 2022-08-04 DIAGNOSIS — E1169 Type 2 diabetes mellitus with other specified complication: Secondary | ICD-10-CM

## 2022-08-04 MED ORDER — TRULICITY 1.5 MG/0.5ML ~~LOC~~ SOAJ: SUBCUTANEOUS | 3 refills | Status: DC

## 2022-08-19 ENCOUNTER — Other Ambulatory Visit: Payer: Self-pay | Admitting: Student

## 2022-08-19 DIAGNOSIS — E1169 Type 2 diabetes mellitus with other specified complication: Secondary | ICD-10-CM

## 2022-08-19 MED ORDER — TRULICITY 1.5 MG/0.5ML ~~LOC~~ SOAJ: SUBCUTANEOUS | 3 refills | Status: DC

## 2022-08-19 NOTE — Progress Notes (Signed)
Patient called the after-hours line to report that his pharmacy is out of Trulicity.  Request that refill be sent to [facility] instead.  Refill sent.  Marnee Guarneri, MD

## 2022-08-29 ENCOUNTER — Telehealth: Payer: Self-pay

## 2022-08-29 NOTE — Telephone Encounter (Signed)
Patient calls nurse line regarding Trulicity prescription. He reports that his pharmacy has been trying to get medication in stock for the last several weeks. He has not had medication in the last 2-3 weeks.   He states that he has looked at other pharmacies, however, they are out of stock as well.   He is requesting alternative prescription. Please advise.    Patient would like returned call at [phone] for further advisement.

## 2022-08-30 ENCOUNTER — Other Ambulatory Visit: Payer: Self-pay | Admitting: Student

## 2022-08-30 MED ORDER — SEMAGLUTIDE(0.25 OR 0.5MG/DOS) 2 MG/3ML ~~LOC~~ SOPN: PEN_INJECTOR | SUBCUTANEOUS | 2 refills | Status: DC

## 2022-08-30 NOTE — Progress Notes (Signed)
Patient pharmacy and local pharmacies out of Trulicity. Patient asking for alternative, Ozempic 0.5 mg sent in.

## 2022-08-31 NOTE — Telephone Encounter (Signed)
Attempted to call patient and inform of Rx switch to Ozempic 0.5 mg q wk. Called number given in nurse encounter but no answer. Rx already sent in.

## 2022-09-05 NOTE — Telephone Encounter (Signed)
Patient calls nurse line returning PCP call.   Patient reports he would really like to speak with him in regards to possible side effects with using Ozempic. He reports he has not heard "good things."   Patient advised PCP is working nights this week. He reports he does not care when he calls.   Will forward to PCP.

## 2022-09-07 NOTE — Telephone Encounter (Signed)
Patient calls nurse line again requesting to speak with PCP about Ozempic.

## 2022-09-14 NOTE — Telephone Encounter (Signed)
Patient called and has started medication. Questions answered.

## 2022-10-20 ENCOUNTER — Other Ambulatory Visit: Payer: Self-pay | Admitting: Student

## 2022-11-18 NOTE — Progress Notes (Deleted)
Subjective:   Calvin Newton is a 72 y.o. male who presents for Medicare Annual/Subsequent preventive examination.  I connected with  [name] on 11/18/22 by a {Video Enabled:26378::"video and audio"} enabled telemedicine application and verified that I am speaking with the correct person using two identifiers.  {Patient [phone]::"Home"}  {Provider Location:[phone]::"Home Office"}  I discussed the limitations of evaluation and management by telemedicine. The patient expressed understanding and agreed to proceed.  Review of Systems    ***       Objective:    There were no vitals filed for this visit. There is no height or weight on file to calculate BMI.     05/13/2022   11:19 AM 12/02/2021   11:23 AM 11/05/2021   12:56 PM 08/23/2021   11:35 AM 04/23/2021    2:02 PM 12/02/2020    4:20 PM 09/21/2020    4:34 PM  Advanced Directives  Does Patient Have a Medical Advance Directive? No No No No No No No  Would patient like information on creating a medical advance directive? No - Patient declined No - Patient declined Yes (MAU/Ambulatory/Procedural Areas - Information given) No - Patient declined No - Patient declined No - Patient declined No - Patient declined    Current Medications (verified) Outpatient Encounter Medications as of 11/18/2022  Medication Sig   aspirin 81 MG EC tablet Take 81 mg by mouth daily.   atorvastatin (LIPITOR) 20 MG tablet TAKE 1 TABLET BY MOUTH DAILY   hydroxypropyl methylcellulose / hypromellose (ISOPTO TEARS / GONIOVISC) 2.5 % ophthalmic solution Place 1 drop into both eyes as needed for dry eyes.   losartan (COZAAR) 25 MG tablet Take 1 tablet (25 mg total) by mouth at bedtime.   metFORMIN (GLUCOPHAGE) 1000 MG tablet TAKE 1 TABLET BY MOUTH TWICE  DAILY WITH MEALS   Multiple Vitamins-Minerals (ICAPS AREDS 2 PO) Take 1 capsule by mouth daily.    Multiple Vitamins-Minerals (PRESERVISION AREDS 2+MULTI VIT PO) Take 1 capsule by mouth  daily.   omeprazole (PRILOSEC) 20 MG capsule 1 cap(s) (Patient not taking: Reported on 04/23/2021)   Semaglutide,0.25 or 0.5MG /DOS, 2 MG/3ML SOPN Take 0.5 mg weekly   No facility-administered encounter medications on file as of 11/18/2022.    Allergies (verified) Patient has no known allergies.   History: Past Medical History:  Diagnosis Date   Abdominal pain, chronic, right lower quadrant 05/28/2013   Chronic persistent, stable. Remains unclear etiology, seems functional with improvement after dietary changes, considered IBS - Chronic >30 yrs, Hx negative colonoscopy  Referred back to GI (Dr. Jeani Hawking - [facility]) - Last seen 09/08/14 - dx chronic RLQ abd pain, dysphagia, GERD. Reproducible pain on deep RLQ abd pain with radiation up to chest, thought to be due to MSK. Ordered Abd    Dysphagia 07/08/2020   Enlarged prostate on rectal examination 06/27/2019   History of hypertension 07/26/2016   Osteoarthritis    T2DM (type 2 diabetes mellitus) (HCC) 04/2010   Past Surgical History:  Procedure Laterality Date   TOTAL KNEE ARTHROPLASTY     Left   Family History  Problem Relation Age of Onset   Colon cancer Neg Hx    Esophageal cancer Neg Hx    Stomach cancer Neg Hx    Pancreatic cancer Neg Hx    Social History   Socioeconomic History   Marital status: Single    Spouse name: Not on file   Number of children: 0   Years of  education: 12   Highest education level: High school graduate  Occupational History    Employer: OTHER  Tobacco Use   Smoking status: Never    Passive exposure: Never   Smokeless tobacco: Never  Vaping Use   Vaping Use: Never used  Substance and Sexual Activity   Alcohol use: No   Drug use: No   Sexual activity: Not Currently  Other Topics Concern   Not on file  Social History Narrative   Patient lives alone.   Darrold Junker married and has no children.    Patient works on a mail truck 6 days per week.    Patient exercises for 60 minutes 6x per  week.    Cardio and strength training.    Very active in church.    Social Determinants of Health   Financial Resource Strain: Low Risk  (11/05/2021)   Overall Financial Resource Strain (CARDIA)    Difficulty of Paying Living Expenses: Not hard at all  Food Insecurity: No Food Insecurity (11/05/2021)   Hunger Vital Sign    Worried About Running Out of Food in the Last Year: Never true    Ran Out of Food in the Last Year: Never true  Transportation Needs: No Transportation Needs (11/05/2021)   PRAPARE - Administrator, Civil Service (Medical): No    Lack of Transportation (Non-Medical): No  Physical Activity: Sufficiently Active (11/05/2021)   Exercise Vital Sign    Days of Exercise per Week: 6 days    Minutes of Exercise per Session: 60 min  Stress: No Stress Concern Present (11/05/2021)   [employer] of Occupational Health - Occupational Stress Questionnaire    Feeling of Stress : Only a little  Social Connections: Moderately Integrated (11/05/2021)   Social Connection and Isolation Panel [NHANES]    Frequency of Communication with Friends and Family: More than three times a week    Frequency of Social Gatherings with Friends and Family: More than three times a week    Attends Religious Services: More than 4 times per year    Active Member of [employer] or Organizations: Yes    Attends Engineer, structural: More than 4 times per year    Marital Status: Never married    Tobacco Counseling Counseling given: Not Answered   Clinical Intake:                 Diabetic?Yes   Nutrition Risk Assessment:  Has the patient had any N/V/D within the last 2 months?  {YES/NO:21197} Does the patient have any non-healing wounds?  {YES/NO:21197} Has the patient had any unintentional weight loss or weight gain?  {YES/NO:21197}  Diabetes:  Is the patient diabetic?  {YES/NO:21197} If diabetic, was a CBG obtained today?  {YES/NO:21197} Did the patient bring in  their glucometer from home?  {YES/NO:21197} How often do you monitor your CBG's? ***.   Financial Strains and Diabetes Management:  Are you having any financial strains with the device, your supplies or your medication? {YES/NO:21197}.  Does the patient want to be seen by Chronic Care Management for management of their diabetes?  {YES/NO:21197} Would the patient like to be referred to a Nutritionist or for Diabetic Management?  {YES/NO:21197}  Diabetic Exams:  Diabetic Eye Exam: Completed 11/17/21 Diabetic Foot Exam: Overdue, Pt has been advised about the importance in completing this exam. Pt is scheduled for diabetic foot exam on at next office visit .          Activities of Daily  Living     No data to display          Patient Care Team: Bess Kinds, MD as PCP - General (Family Medicine)  Indicate any recent Medical Services you may have received from other than [facility] providers in the past year (date may be approximate).     Assessment:   This is a routine wellness examination for [place].  Hearing/Vision screen No results found.  Dietary issues and exercise activities discussed:     Goals Addressed   None    Depression Screen    05/13/2022   11:18 AM 12/02/2021   11:24 AM 11/05/2021   12:56 PM 11/05/2021   12:55 PM 08/23/2021   11:35 AM 04/23/2021    2:01 PM 01/04/2021   11:08 AM  PHQ 2/9 Scores  PHQ - 2 Score 0 0 0 0 0 0 0  PHQ- 9 Score 1 2 1  1  0 0    Fall Risk    05/13/2022   11:18 AM 12/02/2021   11:23 AM 11/05/2021   12:57 PM 08/23/2021   11:35 AM 04/23/2021    2:01 PM  Fall Risk   Falls in the past year? 0 0 1 0 0  Number falls in past yr: 0 0 0 0 0  Injury with Fall? 0  0 0 0  Risk for fall due to :   No Fall Risks    Risk for fall due to: Comment   Not paying attention- no fall concerns    Follow up   Falls prevention discussed      FALL RISK PREVENTION PERTAINING TO THE HOME:  Any stairs in or around the home? {YES/NO:21197} If so,  are there any without handrails? {YES/NO:21197} Home free of loose throw rugs in walkways, pet beds, electrical cords, etc? {YES/NO:21197} Adequate lighting in your home to reduce risk of falls? {YES/NO:21197}  ASSISTIVE DEVICES UTILIZED TO PREVENT FALLS:  Life alert? {YES/NO:21197} Use of a cane, walker or w/c? {YES/NO:21197} Grab bars in the bathroom? {YES/NO:21197} Shower chair or bench in shower? {YES/NO:21197} Elevated toilet seat or a handicapped toilet? {YES/NO:21197}  TIMED UP AND GO:  Was the test performed? No . Telephonic visit   Cognitive Function:        11/05/2021   12:58 PM  6CIT Screen  What Year? 0 points  What month? 0 points  What time? 0 points  Count back from 20 0 points  Months in reverse 0 points  Repeat phrase 0 points  Total Score 0 points    Immunizations Immunization History  Administered Date(s) Administered   Fluad Quad(high Dose 65+) 03/03/2020, 04/23/2021   Influenza,inj,Quad PF,6+ Mos 02/11/2013, 03/19/2014, 03/20/2019   PFIZER(Purple Top)SARS-COV-2 Vaccination 08/02/2019, 08/27/2019, 04/06/2020   Pneumococcal Conjugate-13 08/31/2015   Pneumococcal Polysaccharide-23 11/22/2018   Tdap 03/22/2011, 01/13/2018    TDAP status: Up to date  Pneumococcal vaccine status: Up to date  Covid-19 vaccine status: Information provided on how to obtain vaccines.   Qualifies for Shingles Vaccine? Yes   Zostavax completed No   Shingrix Completed?: No.    Education has been provided regarding the importance of this vaccine. Patient has been advised to call insurance company to determine out of pocket expense if they have not yet received this vaccine. Advised may also receive vaccine at local pharmacy or Health Dept. Verbalized acceptance and understanding.  Screening Tests Health Maintenance  Topic Date Due   Zoster Vaccines- Shingrix (1 of 2) Never done   COVID-19 Vaccine (  4 - 2023-24 season) 02/11/2022   FOOT EXAM  04/23/2022   Diabetic kidney  evaluation - eGFR measurement  08/24/2022   Medicare Annual Wellness (AWV)  10/29/2022   HEMOGLOBIN A1C  11/12/2022   OPHTHALMOLOGY EXAM  11/18/2022   INFLUENZA VACCINE  01/12/2023   Diabetic kidney evaluation - Urine ACR  05/14/2023   DTaP/Tdap/Td (3 - Td or Tdap) 01/14/2028   Colonoscopy  04/30/2028   Pneumonia Vaccine 57+ Years old  Completed   Hepatitis C Screening  Completed   HPV VACCINES  Aged Out    Health Maintenance  Health Maintenance Due  Topic Date Due   Zoster Vaccines- Shingrix (1 of 2) Never done   COVID-19 Vaccine (4 - 2023-24 season) 02/11/2022   FOOT EXAM  04/23/2022   Diabetic kidney evaluation - eGFR measurement  08/24/2022   Medicare Annual Wellness (AWV)  10/29/2022   HEMOGLOBIN A1C  11/12/2022   OPHTHALMOLOGY EXAM  11/18/2022    Colorectal cancer screening: Type of screening: Colonoscopy. Completed 04/30/18. Repeat every 10 years  Lung Cancer Screening: (Low Dose CT Chest recommended if Age 50-80 years, 30 pack-year currently smoking OR have quit w/in 15years.) does not qualify.   Lung Cancer Screening Referral: n/a  Additional Screening:  Hepatitis C Screening: does qualify; Completed 09/13/17  Vision Screening: Recommended annual ophthalmology exams for early detection of glaucoma and other disorders of the eye. Is the patient up to date with their annual eye exam?  Yes  Who is the provider or what is the name of the office in which the patient attends annual eye exams? Dr. Conley Rolls  If pt is not established with a provider, would they like to be referred to a provider to establish care? No .   Dental Screening: Recommended annual dental exams for proper oral hygiene  Community Resource Referral / Chronic Care Management: CRR required this visit?  {YES/NO:21197}  CCM required this visit?  {YES/NO:21197}     Plan:     I have personally reviewed and noted the following in the patient's chart:   Medical and social history Use of alcohol, tobacco  or illicit drugs  Current medications and supplements including opioid prescriptions. {Opioid [phone]} Functional ability and status Nutritional status Physical activity Advanced directives List of other physicians Hospitalizations, surgeries, and ER visits in previous 12 months Vitals Screenings to include cognitive, depression, and falls Referrals and appointments  In addition, I have reviewed and discussed with patient certain preventive protocols, quality metrics, and best practice recommendations. A written personalized care plan for preventive services as well as general preventive health recommendations were provided to patient.     Durwin Nora, [place]   06/18/1094   Due to this being a virtual visit, the after visit summary with patients personalized plan was offered to patient via mail or my-chart. ***Patient declined at this time./ Patient would like to access on my-chart/ per request, patient was mailed a copy of AVS./ Patient preferred to pick up at office at next visit.  Nurse Notes: ***

## 2023-01-26 ENCOUNTER — Other Ambulatory Visit: Payer: Self-pay | Admitting: Student

## 2023-01-31 ENCOUNTER — Telehealth: Payer: Self-pay

## 2023-01-31 NOTE — Telephone Encounter (Signed)
[employer] sent over fax informing COZAAR 25MG  needs the quantity verified. Please make the correct changes. Thank you! Penni Bombard CMA

## 2023-02-03 NOTE — Telephone Encounter (Signed)
It was put into red team box as a refill, usually after the message is sent the papers are shredded since it is not an actual form. I am not sure how it can be retrieved, sorry! If I can do anything to help please let me know!  Penni Bombard CMA

## 2023-02-03 NOTE — Telephone Encounter (Signed)
Hey! I never got this in my inbox, I haven't seen any fax about this patient.

## 2023-02-06 NOTE — Telephone Encounter (Signed)
Okay, thank you for letting me know! We will wait to see if the fax is resent.

## 2023-02-21 ENCOUNTER — Other Ambulatory Visit: Payer: Self-pay | Admitting: Student

## 2023-02-21 ENCOUNTER — Telehealth: Payer: Self-pay | Admitting: Student

## 2023-02-21 ENCOUNTER — Ambulatory Visit (INDEPENDENT_AMBULATORY_CARE_PROVIDER_SITE_OTHER): Payer: Managed Care, Other (non HMO) | Admitting: Student

## 2023-02-21 ENCOUNTER — Encounter: Payer: Self-pay | Admitting: Student

## 2023-02-21 VITALS — BP 118/70 | HR 99 | Ht 70.5 in | Wt 252.0 lb

## 2023-02-21 DIAGNOSIS — Z23 Encounter for immunization: Secondary | ICD-10-CM | POA: Diagnosis not present

## 2023-02-21 DIAGNOSIS — E785 Hyperlipidemia, unspecified: Secondary | ICD-10-CM | POA: Diagnosis not present

## 2023-02-21 DIAGNOSIS — I10 Essential (primary) hypertension: Secondary | ICD-10-CM

## 2023-02-21 DIAGNOSIS — R109 Unspecified abdominal pain: Secondary | ICD-10-CM

## 2023-02-21 DIAGNOSIS — E1169 Type 2 diabetes mellitus with other specified complication: Secondary | ICD-10-CM | POA: Diagnosis not present

## 2023-02-21 DIAGNOSIS — R10A1 Flank pain, right side: Secondary | ICD-10-CM | POA: Insufficient documentation

## 2023-02-21 LAB — POCT GLYCOSYLATED HEMOGLOBIN (HGB A1C): HbA1c, POC (controlled diabetic range): 7.2 % — AB (ref 0.0–7.0)

## 2023-02-21 NOTE — Assessment & Plan Note (Signed)
Patient report's he's been eating a lot of ice cream and protein shakes, A1c slightly worse today at 7.2. Patient unsure dosing of trulicity but is taking metformin regularly. Will have patient call provider with Trulicity dosing, and may adjust dose from there. Will also have patient f/u w. RD Dr. Gerilyn Pilgrim for weight loss and DM dieting advice. -Call provider w/ Trulicity dosing -Continue Metformin 1000 mg BID -F/u w/ RD -F/u in 3 months

## 2023-02-21 NOTE — Assessment & Plan Note (Addendum)
Patient has attempted multiple statins but had issues with them. Most recently tried atorvastatin, but this was causing itching. Patient willing to try taking statin 2-3 times a week. Will also place referral for lipid clinic, as I do not believe patient will continue statin medicine if itching persist.  -Atorvostatin 2-3 x a week -Ref to lipid clinic -Lipid panel

## 2023-02-21 NOTE — Assessment & Plan Note (Signed)
Patient BP at goal. Continues to take Losartan 25 mg -BMP -Continue Losartan 25 mg

## 2023-02-21 NOTE — Assessment & Plan Note (Signed)
Patient report's history of stomach discomfort. Patient reports side of his abdomen feels numb/weird sometimes, and feels connected to his head. Patient having regular bowel movement, but some times smaller volume. Patient reports BM helps with symptoms. Patient otherwise feeling normal state of health. Unsure what the cause of his symptoms are, but will try Miralax to see if patient needing to be cleaned out. Patient had negative imaging for this 5 years ago, may consider repeat. Patient has hx of umbilical hernia, which is reducible and not causing him problems.  -Miralax 17 g daily, titrate as needed -Consider repeat abdominal imaging

## 2023-02-21 NOTE — Progress Notes (Signed)
SUBJECTIVE:   CHIEF COMPLAINT / HPI:   DM Meds: Metformin 1000 mg BID, Trulicity, unsure dose.  Lab Results  Component Value Date   HGBA1C 6.9 05/13/2022  Report's fasting sugars hanging around 140-160.    HTN On losartan 25 mg daily  HLD Meds: Atorvastatin 20 mg Not taking  Abdominal Discomfort Has been an issue for years, feels like numbness in belly and extends up to ears. Patient note's that BM sometimes help with stomach sensation. Denies any cramping/burning/sharp pain. Note's he has 2 stools a day, but they may not be fully formed.   PERTINENT  PMH / PSH:   Past Medical History:  Diagnosis Date   Abdominal pain, chronic, right lower quadrant 05/28/2013   Chronic persistent, stable. Remains unclear etiology, seems functional with improvement after dietary changes, considered IBS - Chronic >30 yrs, Hx negative colonoscopy  Referred back to GI (Dr. Jeani Hawking - [facility]) - Last seen 09/08/14 - dx chronic RLQ abd pain, dysphagia, GERD. Reproducible pain on deep RLQ abd pain with radiation up to chest, thought to be due to MSK. Ordered Abd    Dysphagia 07/08/2020   Enlarged prostate on rectal examination 06/27/2019   History of hypertension 07/26/2016   Osteoarthritis    T2DM (type 2 diabetes mellitus) (HCC) 04/2010   OBJECTIVE:  BP 118/70   Pulse 99   Ht 5' 10.5" (1.791 m)   Wt 252 lb (114.3 kg)   SpO2 99%   BMI 35.65 kg/m  Physical Exam Constitutional:      General: He is not in acute distress.    Appearance: Normal appearance. He is not ill-appearing.  Cardiovascular:     Rate and Rhythm: Normal rate and regular rhythm.     Pulses: Normal pulses.     Heart sounds: Normal heart sounds. No murmur heard.    No friction rub. No gallop.  Pulmonary:     Effort: Pulmonary effort is normal. No respiratory distress.     Breath sounds: Normal breath sounds. No stridor. No wheezing, rhonchi or rales.  Abdominal:     General: Bowel sounds are normal. There  is no distension.     Palpations: Abdomen is soft.     Tenderness: There is no abdominal tenderness.     Hernia: A hernia is present.  Neurological:     Mental Status: He is alert.  Psychiatric:        Mood and Affect: Mood normal.        Behavior: Behavior normal.      ASSESSMENT/PLAN:  Type 2 diabetes mellitus with other specified complication, unspecified whether long term insulin use (HCC) Assessment & Plan: Patient report's he's been eating a lot of ice cream and protein shakes, A1c slightly worse today at 7.2. Patient unsure dosing of trulicity but is taking metformin regularly. Will have patient call provider with Trulicity dosing, and may adjust dose from there. Will also have patient f/u w. RD Dr. Gerilyn Pilgrim for weight loss and DM dieting advice. -Call provider w/ Trulicity dosing -Continue Metformin 1000 mg BID -F/u w/ RD -F/u in 3 months  Orders: -     POCT glycosylated hemoglobin (Hb A1C) -     Lipid panel -     Basic metabolic panel -     AMB Referral to [facility]  Encounter for immunization -     Flu Vaccine Trivalent High Dose (Fluad)  Hyperlipidemia associated with type 2 diabetes mellitus [facility]) Assessment & Plan: Patient  has attempted multiple statins but had issues with them. Most recently tried atorvastatin, but this was causing itching. Patient willing to try taking statin 2-3 times a week. Will also place referral for lipid clinic, as I do not believe patient will continue statin medicine if itching persist.  -Atorvostatin 2-3 x a week -Ref to lipid clinic -Lipid panel  Orders: -     AMB Referral to [facility]  Abdominal discomfort in right flank Assessment & Plan: Patient report's history of stomach discomfort. Patient reports side of his abdomen feels numb/weird sometimes, and feels connected to his head. Patient having regular bowel movement, but some times smaller volume. Patient reports BM helps with symptoms.  Patient otherwise feeling normal state of health. Unsure what the cause of his symptoms are, but will try Miralax to see if patient needing to be cleaned out. Patient had negative imaging for this 5 years ago, may consider repeat. Patient has hx of umbilical hernia, which is reducible and not causing him problems.  -Miralax 17 g daily, titrate as needed -Consider repeat abdominal imaging   Primary hypertension Assessment & Plan: Patient BP at goal. Continues to take Losartan 25 mg -BMP -Continue Losartan 25 mg    No follow-ups on file. Bess Kinds, MD 02/21/2023, 12:21 PM PGY-3, [facility]

## 2023-02-21 NOTE — Patient Instructions (Addendum)
It was great to see you! Thank you for allowing me to participate in your care!  I recommend that you always bring your medications to each appointment as this makes it easy to ensure we are on the correct medications and helps [place] not miss when refills are needed.  Our plans for today:  - Diabetes Your A1c is a little worse today. I'm going to recommend you schedule an appointment with our dietician, Dr. Danie Chandler.  Call the clinic and leave a message about your Trulicity dosing  Follow up in 3 months  -High Cholesterol I'm going to connect you with the lipid clinic to see about finding a medication to start.   - Abdominal Pain/numbness I'm not sure what this is coming from, but I don't think this is something scary and bad. We will try some Miralax and see if this helps. Available over the counter, in most pharmacies.   Miralax 17 g, once a day  *allow for 5 days, can take 3-4 days to start working  ** Stop taking, or decrease frequency if having loose watery stools  We are checking some labs today, I will call you if they are abnormal will send you a MyChart message or a letter if they are normal.  If you do not hear about your labs in the next 2 weeks please let us know.  Take care and seek immediate care sooner if you develop any concerns.   Dr. Bess Kinds, MD [facility]

## 2023-02-21 NOTE — Telephone Encounter (Signed)
Pt states the Trulicity dosage is 1.5 Milligrams or 0.5 Milliliter

## 2023-02-22 ENCOUNTER — Encounter: Payer: Self-pay | Admitting: Student

## 2023-02-22 LAB — BASIC METABOLIC PANEL
BUN/Creatinine Ratio: 15 (ref 10–24)
BUN: 15 mg/dL (ref 8–27)
CO2: 24 mmol/L (ref 20–29)
Calcium: 10 mg/dL (ref 8.6–10.2)
Chloride: 96 mmol/L (ref 96–106)
Creatinine, Ser: 1 mg/dL (ref 0.76–1.27)
Glucose: 135 mg/dL — ABNORMAL HIGH (ref 70–99)
Potassium: 5.2 mmol/L (ref 3.5–5.2)
Sodium: 137 mmol/L (ref 134–144)
eGFR: 81 mL/min/{1.73_m2} (ref >59)

## 2023-02-22 LAB — LIPID PANEL
Chol/HDL Ratio: 4.5 ratio (ref 0.0–5.0)
Cholesterol, Total: 209 mg/dL — ABNORMAL HIGH (ref 100–199)
HDL: 46 mg/dL (ref >39)
LDL Chol Calc (NIH): 98 mg/dL (ref 0–99)
Triglycerides: 388 mg/dL — ABNORMAL HIGH (ref 0–149)
VLDL Cholesterol Cal: 65 mg/dL — ABNORMAL HIGH (ref 5–40)

## 2023-04-07 ENCOUNTER — Other Ambulatory Visit: Payer: Self-pay

## 2023-04-07 ENCOUNTER — Ambulatory Visit (INDEPENDENT_AMBULATORY_CARE_PROVIDER_SITE_OTHER): Payer: 59

## 2023-04-07 VITALS — Ht 70.5 in | Wt 252.0 lb

## 2023-04-07 DIAGNOSIS — Z0000 Encounter for general adult medical examination without abnormal findings: Secondary | ICD-10-CM

## 2023-04-07 NOTE — Patient Instructions (Signed)
Mr. Hellwig , Thank you for taking time to come for your Medicare Wellness Visit. I appreciate your ongoing commitment to your health goals. Please review the following plan we discussed and let me know if I can assist you in the future.   Referrals/Orders/Follow-Ups/Clinician Recommendations: Aim for 30 minutes of exercise or brisk walking, 6-8 glasses of water, and 5 servings of fruits and vegetables each day.  This is a list of the screening recommended for you and due dates:  Health Maintenance  Topic Date Due   Zoster (Shingles) Vaccine (1 of 2) Never done   Complete foot exam   04/23/2022   Eye exam for diabetics  11/18/2022   COVID-19 Vaccine (4 - 2023-24 season) 02/12/2023   Yearly kidney health urinalysis for diabetes  05/14/2023   Hemoglobin A1C  08/21/2023   Yearly kidney function blood test for diabetes  02/21/2024   Medicare Annual Wellness Visit  04/06/2024   DTaP/Tdap/Td vaccine (3 - Td or Tdap) 01/14/2028   Colon Cancer Screening  04/30/2028   Pneumonia Vaccine  Completed   Flu Shot  Completed   Hepatitis C Screening  Completed   HPV Vaccine  Aged Out    Advanced directives: (ACP Link)Information on Advanced Care Planning can be found at [facility] Advance Health Care Directives Advance Health Care Directives ([email or web address]   Next Medicare Annual Wellness Visit scheduled for next year: Yes

## 2023-04-07 NOTE — Telephone Encounter (Signed)
Patient seen for AWV and states that he received correspondence from his pharmacy that his Trulicity was unavailable and for him to contact his provider.    Patient is also requesting a refill of his Losartan.  Mistakenly sent in previously for the wrong quantity.

## 2023-04-07 NOTE — Progress Notes (Signed)
Subjective:   Calvin Newton is a 72 y.o. male who presents for Medicare Annual/Subsequent preventive examination.  Visit Complete: Virtual I connected with  Calvin Newton on 04/07/23 by a audio enabled telemedicine application and verified that I am speaking with the correct person using two identifiers.  Patient Location: Home  Provider Location: Home Office  I discussed the limitations of evaluation and management by telemedicine. The patient expressed understanding and agreed to proceed.  Vital Signs: Because this visit was a virtual/telehealth visit, some criteria may be missing or patient reported. Any vitals not documented were not able to be obtained and vitals that have been documented are patient reported.  Cardiac Risk Factors include: advanced age (>63men, >46 women);diabetes mellitus;dyslipidemia;hypertension;male gender     Objective:    Today's Vitals   04/07/23 1501  Weight: 252 lb (114.3 kg)  Height: 5' 10.5" (1.791 m)   Body mass index is 35.65 kg/m.     04/07/2023    3:09 PM 02/21/2023   11:12 AM 05/13/2022   11:19 AM 12/02/2021   11:23 AM 11/05/2021   12:56 PM 08/23/2021   11:35 AM 04/23/2021    2:02 PM  Advanced Directives  Does Patient Have a Medical Advance Directive? No No No No No No No  Would patient like information on creating a medical advance directive? Yes (MAU/Ambulatory/Procedural Areas - Information given) No - Patient declined No - Patient declined No - Patient declined Yes (MAU/Ambulatory/Procedural Areas - Information given) No - Patient declined No - Patient declined    Current Medications (verified) Outpatient Encounter Medications as of 04/07/2023  Medication Sig   aspirin 81 MG EC tablet Take 81 mg by mouth daily.   atorvastatin (LIPITOR) 20 MG tablet TAKE 1 TABLET BY MOUTH DAILY   hydroxypropyl methylcellulose / hypromellose (ISOPTO TEARS / GONIOVISC) 2.5 % ophthalmic solution Place 1 drop into both eyes as needed for dry  eyes.   losartan (COZAAR) 25 MG tablet Take 1 tablet (25 mg total) by mouth at bedtime. NEEDS VISIT BEFORE NEXT FILL   metFORMIN (GLUCOPHAGE) 1000 MG tablet TAKE 1 TABLET BY MOUTH TWICE  DAILY WITH MEALS   Multiple Vitamins-Minerals (ICAPS AREDS 2 PO) Take 1 capsule by mouth daily.    Multiple Vitamins-Minerals (PRESERVISION AREDS 2+MULTI VIT PO) Take 1 capsule by mouth daily.   Dulaglutide (TRULICITY) 1.5 MG/0.5ML SOPN Inject 1.5 mg into the skin once a week. (Patient not taking: Reported on 04/07/2023)   omeprazole (PRILOSEC) 20 MG capsule 1 cap(s) (Patient not taking: Reported on 04/23/2021)   No facility-administered encounter medications on file as of 04/07/2023.    Allergies (verified) Patient has no known allergies.   History: Past Medical History:  Diagnosis Date   Abdominal pain, chronic, right lower quadrant 05/28/2013   Chronic persistent, stable. Remains unclear etiology, seems functional with improvement after dietary changes, considered IBS - Chronic >30 yrs, Hx negative colonoscopy  Referred back to GI (Dr. Jeani Hawking - [facility]) - Last seen 09/08/14 - dx chronic RLQ abd pain, dysphagia, GERD. Reproducible pain on deep RLQ abd pain with radiation up to chest, thought to be due to MSK. Ordered Abd    Dysphagia 07/08/2020   Enlarged prostate on rectal examination 06/27/2019   History of hypertension 07/26/2016   Osteoarthritis    T2DM (type 2 diabetes mellitus) (HCC) 04/2010   Past Surgical History:  Procedure Laterality Date   TOTAL KNEE ARTHROPLASTY     Left   Family History  Problem Relation Age of Onset   Colon cancer Neg Hx    Esophageal cancer Neg Hx    Stomach cancer Neg Hx    Pancreatic cancer Neg Hx    Social History   Socioeconomic History   Marital status: Single    Spouse name: Not on file   Number of children: 0   Years of education: 12   Highest education level: High school graduate  Occupational History    Employer: OTHER  Tobacco  Use   Smoking status: Never    Passive exposure: Never   Smokeless tobacco: Never  Vaping Use   Vaping status: Never Used  Substance and Sexual Activity   Alcohol use: No   Drug use: No   Sexual activity: Not Currently  Other Topics Concern   Not on file  Social History Narrative   Patient lives alone.   Darrold Junker married and has no children.    Patient works on a mail truck 6 days per week.    Patient exercises for 60 minutes 6x per week.    Cardio and strength training.    Very active in church.    Social Determinants of Health   Financial Resource Strain: Low Risk  (04/07/2023)   Overall Financial Resource Strain (CARDIA)    Difficulty of Paying Living Expenses: Not hard at all  Food Insecurity: No Food Insecurity (11/05/2021)   Hunger Vital Sign    Worried About Running Out of Food in the Last Year: Never true    Ran Out of Food in the Last Year: Never true  Transportation Needs: No Transportation Needs (04/07/2023)   PRAPARE - Administrator, Civil Service (Medical): No    Lack of Transportation (Non-Medical): No  Physical Activity: Sufficiently Active (04/07/2023)   Exercise Vital Sign    Days of Exercise per Week: 5 days    Minutes of Exercise per Session: 30 min  Stress: No Stress Concern Present (04/07/2023)   [employer] of Occupational Health - Occupational Stress Questionnaire    Feeling of Stress : Not at all  Social Connections: Moderately Integrated (04/07/2023)   Social Connection and Isolation Panel [NHANES]    Frequency of Communication with Friends and Family: More than three times a week    Frequency of Social Gatherings with Friends and Family: Three times a week    Attends Religious Services: More than 4 times per year    Active Member of Clubs or Organizations: Yes    Attends Engineer, structural: More than 4 times per year    Marital Status: Never married    Tobacco Counseling Counseling given: Not Answered   Clinical  Intake:  Pre-visit preparation completed: Yes  Pain : No/denies pain     Diabetes: Yes CBG done?: No Did pt. bring in CBG monitor from home?: No  How often do you need to have someone help you when you read instructions, pamphlets, or other written materials from your doctor or pharmacy?: 1 - Never  Interpreter Needed?: No  Information entered by :: Kandis Fantasia LPN   Activities of Daily Living    04/07/2023    3:05 PM  In your present state of health, do you have any difficulty performing the following activities:  Hearing? 0  Vision? 0  Difficulty concentrating or making decisions? 0  Walking or climbing stairs? 0  Dressing or bathing? 0  Doing errands, shopping? 0  Preparing Food and eating ? N  Using the Toilet? N  In the past six months, have you accidently leaked urine? N  Do you have problems with loss of bowel control? N  Managing your Medications? N  Managing your Finances? N  Housekeeping or managing your Housekeeping? N    Patient Care Team: Bess Kinds, MD as PCP - General (Family Medicine)  Indicate any recent Medical Services you may have received from other than [facility] providers in the past year (date may be approximate).     Assessment:   This is a routine wellness examination for [place].  Hearing/Vision screen Hearing Screening - Comments:: Denies hearing difficulties   Vision Screening - Comments:: No vision problems; will schedule routine eye exam soon     Goals Addressed             This Visit's Progress    Remain active and independent        Depression Screen    04/07/2023    3:07 PM 02/21/2023   11:58 AM 05/13/2022   11:18 AM 12/02/2021   11:24 AM 11/05/2021   12:56 PM 11/05/2021   12:55 PM 08/23/2021   11:35 AM  PHQ 2/9 Scores  PHQ - 2 Score 1 1 0 0 0 0 0  PHQ- 9 Score 3 4 1 2 1  1     Fall Risk    04/07/2023    3:10 PM 02/21/2023   11:18 AM 05/13/2022   11:18 AM 12/02/2021   11:23 AM 11/05/2021   12:57 PM  Fall  Risk   Falls in the past year? 0 0 0 0 1  Number falls in past yr: 0 0 0 0 0  Injury with Fall? 0 0 0  0  Risk for fall due to : No Fall Risks No Fall Risks   No Fall Risks  Risk for fall due to: Comment     Not paying attention- no fall concerns  Follow up Falls prevention discussed;Education provided;Falls evaluation completed Falls prevention discussed   Falls prevention discussed    MEDICARE RISK AT HOME: Medicare Risk at Home Any stairs in or around the home?: No If so, are there any without handrails?: No Home free of loose throw rugs in walkways, pet beds, electrical cords, etc?: Yes Adequate lighting in your home to reduce risk of falls?: Yes Life alert?: No Use of a cane, walker or w/c?: No Grab bars in the bathroom?: Yes Shower chair or bench in shower?: No Elevated toilet seat or a handicapped toilet?: Yes  TIMED UP AND GO:  Was the test performed?  No    Cognitive Function:        04/07/2023    3:10 PM 11/05/2021   12:58 PM  6CIT Screen  What Year? 0 points 0 points  What month? 0 points 0 points  What time? 0 points 0 points  Count back from 20 0 points 0 points  Months in reverse 0 points 0 points  Repeat phrase 0 points 0 points  Total Score 0 points 0 points    Immunizations Immunization History  Administered Date(s) Administered   Fluad Quad(high Dose 65+) 03/03/2020, 04/23/2021   Fluad Trivalent(High Dose 65+) 02/21/2023   Influenza,inj,Quad PF,6+ Mos 02/11/2013, 03/19/2014, 03/20/2019   PFIZER(Purple Top)SARS-COV-2 Vaccination 08/02/2019, 08/27/2019, 04/06/2020   Pneumococcal Conjugate-13 08/31/2015   Pneumococcal Polysaccharide-23 11/22/2018   Tdap 03/22/2011, 01/13/2018    TDAP status: Up to date  Flu Vaccine status: Up to date  Pneumococcal vaccine status: Up to date  Covid-19 vaccine status: Information provided  on how to obtain vaccines.   Qualifies for Shingles Vaccine? Yes   Zostavax completed No   Shingrix Completed?: No.     Education has been provided regarding the importance of this vaccine. Patient has been advised to call insurance company to determine out of pocket expense if they have not yet received this vaccine. Advised may also receive vaccine at local pharmacy or Health Dept. Verbalized acceptance and understanding.  Screening Tests Health Maintenance  Topic Date Due   Zoster Vaccines- Shingrix (1 of 2) Never done   FOOT EXAM  04/23/2022   OPHTHALMOLOGY EXAM  11/18/2022   COVID-19 Vaccine (4 - 2023-24 season) 02/12/2023   Diabetic kidney evaluation - Urine ACR  05/14/2023   HEMOGLOBIN A1C  08/21/2023   Diabetic kidney evaluation - eGFR measurement  02/21/2024   Medicare Annual Wellness (AWV)  04/06/2024   DTaP/Tdap/Td (3 - Td or Tdap) 01/14/2028   Colonoscopy  04/30/2028   Pneumonia Vaccine 57+ Years old  Completed   INFLUENZA VACCINE  Completed   Hepatitis C Screening  Completed   HPV VACCINES  Aged Out    Health Maintenance  Health Maintenance Due  Topic Date Due   Zoster Vaccines- Shingrix (1 of 2) Never done   FOOT EXAM  04/23/2022   OPHTHALMOLOGY EXAM  11/18/2022   COVID-19 Vaccine (4 - 2023-24 season) 02/12/2023    Colorectal cancer screening: Type of screening: Colonoscopy. Completed 04/30/18. Repeat every 10 years  Lung Cancer Screening: (Low Dose CT Chest recommended if Age 82-80 years, 20 pack-year currently smoking OR have quit w/in 15years.) does not qualify.   Lung Cancer Screening Referral: n/a  Additional Screening:  Hepatitis C Screening: does qualify; Completed 09/13/17  Vision Screening: Recommended annual ophthalmology exams for early detection of glaucoma and other disorders of the eye. Is the patient up to date with their annual eye exam?  No  Who is the provider or what is the name of the office in which the patient attends annual eye exams? none If pt is not established with a provider, would they like to be referred to a provider to establish care? No .    Dental Screening: Recommended annual dental exams for proper oral hygiene  Diabetic Foot Exam: Diabetic Foot Exam: Overdue, Pt has been advised about the importance in completing this exam. Pt is scheduled for diabetic foot exam on at next office visit.  Community Resource Referral / Chronic Care Management: CRR required this visit?  No   CCM required this visit?  No     Plan:     I have personally reviewed and noted the following in the patient's chart:   Medical and social history Use of alcohol, tobacco or illicit drugs  Current medications and supplements including opioid prescriptions. Patient is not currently taking opioid prescriptions. Functional ability and status Nutritional status Physical activity Advanced directives List of other physicians Hospitalizations, surgeries, and ER visits in previous 12 months Vitals Screenings to include cognitive, depression, and falls Referrals and appointments  In addition, I have reviewed and discussed with patient certain preventive protocols, quality metrics, and best practice recommendations. A written personalized care plan for preventive services as well as general preventive health recommendations were provided to patient.     Kandis Fantasia [place], [place]   [id number]   After Visit Summary: (Mail) Due to this being a telephonic visit, the after visit summary with patients personalized plan was offered to patient via mail   Nurse Notes: Patient is requesting  medication refills; see telephone note.

## 2023-04-11 MED ORDER — LOSARTAN POTASSIUM 25 MG PO TABS: 25.0000 mg | ORAL_TABLET | Freq: Every day | ORAL | 2 refills | Status: DC

## 2023-05-02 ENCOUNTER — Other Ambulatory Visit: Payer: Self-pay

## 2023-05-02 MED ORDER — LOSARTAN POTASSIUM 25 MG PO TABS: 25.0000 mg | ORAL_TABLET | Freq: Every day | ORAL | 0 refills | Status: DC

## 2023-05-25 ENCOUNTER — Other Ambulatory Visit: Payer: Self-pay | Admitting: Student

## 2023-06-22 DIAGNOSIS — H40033 Anatomical narrow angle, bilateral: Secondary | ICD-10-CM | POA: Diagnosis not present

## 2023-06-22 DIAGNOSIS — E119 Type 2 diabetes mellitus without complications: Secondary | ICD-10-CM | POA: Diagnosis not present

## 2023-07-28 ENCOUNTER — Telehealth: Payer: Self-pay | Admitting: Family Medicine

## 2023-07-28 DIAGNOSIS — I10 Essential (primary) hypertension: Secondary | ICD-10-CM

## 2023-07-28 MED ORDER — LOSARTAN POTASSIUM 25 MG PO TABS
25.0000 mg | ORAL_TABLET | Freq: Every day | ORAL | 0 refills | Status: DC
Start: 2023-07-28 — End: 2023-11-02

## 2023-07-28 NOTE — Telephone Encounter (Signed)
**  After Hours/ Emergency Line Call**  Received a page to call [phone]) - [phone].  Patient: Calvin Newton  Caller: Self  Confirmed name & DOB of patient with caller  Subjective:   Calling to request refill on losartan Denies symptoms (CP, SOB, HA, vision changes) Reports home bp in 120s/80s Does not have enough to last through weekend    Discussed to not use emergency line for refills in the future   Objective:  Observations: NAD, full sentences  Assessment & Plan  Calvin Newton is a 73 y.o. male with PMHx s/f HTN who calls with the following complaints and concerns:   Needs refill on Losartan 25mg  daiy  -Sent refill given weekend  -Discussed proper use of emergency line  -advised to schedule appt for future refills  -- Red flags discussed.   -- fwd to pcp  Vonna Drafts, MD [facility], PGY-2

## 2023-08-23 ENCOUNTER — Other Ambulatory Visit: Payer: Self-pay | Admitting: Student

## 2023-08-25 ENCOUNTER — Other Ambulatory Visit: Payer: Self-pay | Admitting: Student

## 2023-08-29 ENCOUNTER — Institutional Professional Consult (permissible substitution) (HOSPITAL_BASED_OUTPATIENT_CLINIC_OR_DEPARTMENT_OTHER): Payer: Medicare Other | Admitting: Internal Medicine

## 2023-09-06 NOTE — Telephone Encounter (Signed)
error 

## 2023-11-01 ENCOUNTER — Other Ambulatory Visit: Payer: Self-pay | Admitting: Family Medicine

## 2023-11-01 DIAGNOSIS — I10 Essential (primary) hypertension: Secondary | ICD-10-CM

## 2023-11-28 DIAGNOSIS — G8929 Other chronic pain: Secondary | ICD-10-CM | POA: Diagnosis not present

## 2023-11-28 DIAGNOSIS — E785 Hyperlipidemia, unspecified: Secondary | ICD-10-CM | POA: Diagnosis not present

## 2023-11-28 DIAGNOSIS — M25561 Pain in right knee: Secondary | ICD-10-CM | POA: Diagnosis not present

## 2023-11-28 DIAGNOSIS — E119 Type 2 diabetes mellitus without complications: Secondary | ICD-10-CM | POA: Diagnosis not present

## 2023-11-28 DIAGNOSIS — R109 Unspecified abdominal pain: Secondary | ICD-10-CM | POA: Diagnosis not present

## 2023-11-28 DIAGNOSIS — Z0000 Encounter for general adult medical examination without abnormal findings: Secondary | ICD-10-CM | POA: Diagnosis not present

## 2023-11-28 DIAGNOSIS — I10 Essential (primary) hypertension: Secondary | ICD-10-CM | POA: Diagnosis not present

## 2023-11-29 ENCOUNTER — Other Ambulatory Visit: Payer: Self-pay | Admitting: Student

## 2023-11-29 DIAGNOSIS — Z1321 Encounter for screening for nutritional disorder: Secondary | ICD-10-CM | POA: Diagnosis not present

## 2023-11-29 DIAGNOSIS — Z79899 Other long term (current) drug therapy: Secondary | ICD-10-CM | POA: Diagnosis not present

## 2023-11-29 DIAGNOSIS — R109 Unspecified abdominal pain: Secondary | ICD-10-CM

## 2023-11-29 DIAGNOSIS — Z1159 Encounter for screening for other viral diseases: Secondary | ICD-10-CM | POA: Diagnosis not present

## 2023-12-07 ENCOUNTER — Ambulatory Visit
Admission: RE | Admit: 2023-12-07 | Discharge: 2023-12-07 | Disposition: A | Discharge status: Home / Self Care | Source: Ambulatory Visit | Attending: Student | Admitting: Student

## 2023-12-07 DIAGNOSIS — R109 Unspecified abdominal pain: Secondary | ICD-10-CM

## 2023-12-07 DIAGNOSIS — I700 Atherosclerosis of aorta: Secondary | ICD-10-CM | POA: Diagnosis not present

## 2023-12-07 DIAGNOSIS — K429 Umbilical hernia without obstruction or gangrene: Secondary | ICD-10-CM | POA: Diagnosis not present

## 2023-12-07 DIAGNOSIS — R918 Other nonspecific abnormal finding of lung field: Secondary | ICD-10-CM | POA: Diagnosis not present

## 2023-12-07 MED ORDER — IOPAMIDOL (ISOVUE-300) INJECTION 61%
100.0000 mL | Freq: Once | INTRAVENOUS | Status: AC | PRN
  Administered 2023-12-07: 100 mL via INTRAVENOUS

## 2023-12-11 DIAGNOSIS — I10 Essential (primary) hypertension: Secondary | ICD-10-CM | POA: Diagnosis not present

## 2023-12-11 DIAGNOSIS — R748 Abnormal levels of other serum enzymes: Secondary | ICD-10-CM | POA: Diagnosis not present

## 2023-12-11 DIAGNOSIS — B351 Tinea unguium: Secondary | ICD-10-CM | POA: Diagnosis not present

## 2023-12-11 DIAGNOSIS — E1151 Type 2 diabetes mellitus with diabetic peripheral angiopathy without gangrene: Secondary | ICD-10-CM | POA: Diagnosis not present

## 2023-12-11 DIAGNOSIS — E1169 Type 2 diabetes mellitus with other specified complication: Secondary | ICD-10-CM | POA: Diagnosis not present

## 2023-12-11 DIAGNOSIS — M222X9 Patellofemoral disorders, unspecified knee: Secondary | ICD-10-CM | POA: Diagnosis not present

## 2023-12-11 DIAGNOSIS — Z79899 Other long term (current) drug therapy: Secondary | ICD-10-CM | POA: Diagnosis not present

## 2023-12-11 DIAGNOSIS — Z0001 Encounter for general adult medical examination with abnormal findings: Secondary | ICD-10-CM | POA: Diagnosis not present

## 2023-12-12 DIAGNOSIS — H40033 Anatomical narrow angle, bilateral: Secondary | ICD-10-CM | POA: Diagnosis not present

## 2023-12-12 DIAGNOSIS — E119 Type 2 diabetes mellitus without complications: Secondary | ICD-10-CM | POA: Diagnosis not present

## 2024-01-10 ENCOUNTER — Encounter: Payer: Self-pay | Admitting: Gastroenterology

## 2024-03-08 ENCOUNTER — Encounter: Payer: Self-pay | Admitting: Gastroenterology

## 2024-03-08 ENCOUNTER — Other Ambulatory Visit (INDEPENDENT_AMBULATORY_CARE_PROVIDER_SITE_OTHER)

## 2024-03-08 ENCOUNTER — Ambulatory Visit: Admitting: Gastroenterology

## 2024-03-08 VITALS — BP 110/60 | HR 106 | Ht 71.0 in | Wt 252.0 lb

## 2024-03-08 DIAGNOSIS — R933 Abnormal findings on diagnostic imaging of other parts of digestive tract: Secondary | ICD-10-CM

## 2024-03-08 DIAGNOSIS — G8929 Other chronic pain: Secondary | ICD-10-CM

## 2024-03-08 DIAGNOSIS — R1031 Right lower quadrant pain: Secondary | ICD-10-CM

## 2024-03-08 LAB — CBC WITH DIFFERENTIAL/PLATELET
Basophils Absolute: 0.1 K/uL (ref 0.0–0.1)
Basophils Relative: 1.1 % (ref 0.0–3.0)
Eosinophils Absolute: 0.1 K/uL (ref 0.0–0.7)
Eosinophils Relative: 0.8 % (ref 0.0–5.0)
HCT: 38.9 % — ABNORMAL LOW (ref 39.0–52.0)
Hemoglobin: 12.9 g/dL — ABNORMAL LOW (ref 13.0–17.0)
Lymphocytes Relative: 21.5 % (ref 12.0–46.0)
Lymphs Abs: 1.3 K/uL (ref 0.7–4.0)
MCHC: 33.2 g/dL (ref 30.0–36.0)
MCV: 90.7 fl (ref 78.0–100.0)
Monocytes Absolute: 0.3 K/uL (ref 0.1–1.0)
Monocytes Relative: 4.5 % (ref 3.0–12.0)
Neutro Abs: 4.3 K/uL (ref 1.4–7.7)
Neutrophils Relative %: 72.1 % (ref 43.0–77.0)
Platelets: 188 K/uL (ref 150.0–400.0)
RBC: 4.29 Mil/uL (ref 4.22–5.81)
RDW: 13.7 % (ref 11.5–15.5)
WBC: 6 K/uL (ref 4.0–10.5)

## 2024-03-08 LAB — COMPREHENSIVE METABOLIC PANEL WITH GFR
ALT: 31 U/L (ref 0–53)
AST: 28 U/L (ref 0–37)
Albumin: 4.5 g/dL (ref 3.5–5.2)
Alkaline Phosphatase: 60 U/L (ref 39–117)
BUN: 19 mg/dL (ref 6–23)
CO2: 25 meq/L (ref 19–32)
Calcium: 9.8 mg/dL (ref 8.4–10.5)
Chloride: 105 meq/L (ref 96–112)
Creatinine, Ser: 1.02 mg/dL (ref 0.40–1.50)
GFR: 73.93 mL/min (ref >60.00)
Glucose, Bld: 183 mg/dL — ABNORMAL HIGH (ref 70–99)
Potassium: 4.1 meq/L (ref 3.5–5.1)
Sodium: 138 meq/L (ref 135–145)
Total Bilirubin: 0.4 mg/dL (ref 0.2–1.2)
Total Protein: 6.9 g/dL (ref 6.0–8.3)

## 2024-03-08 LAB — LIPASE: Lipase: 102 U/L — ABNORMAL HIGH (ref 11.0–59.0)

## 2024-03-08 LAB — SEDIMENTATION RATE: Sed Rate: 5 mm/h (ref 0–20)

## 2024-03-08 LAB — TSH: TSH: 1.2 u[IU]/mL (ref 0.35–5.50)

## 2024-03-08 LAB — C-REACTIVE PROTEIN: CRP: <0.5 mg/dL (ref 0.5–20.0)

## 2024-03-08 MED ORDER — LINACLOTIDE 72 MCG PO CAPS: ORAL_CAPSULE | ORAL | 0 refills | Status: AC

## 2024-03-08 NOTE — Progress Notes (Signed)
 Calvin Newton [id number] [birth year]   Chief Complaint: Abdominal pain  Referring Provider: Campbell Reynolds, NP Primary GI MD: Dr. Shila  HPI: Calvin Newton is a 73 y.o. male with past medical history of chronic RLQ abdominal pain, dysphagia, HTN, T2DM who presents today for a complaint of abdominal pain.    Patient has not been seen in office since 03/05/2018 when he saw Greig Corti, PA-C for abnormal CT finding of prominent ileocecal valve. He underwent colonoscopy 04/2018 with finding of a lipomatous ileocecal valve, diverticulosis, and nonbleeding internal hemorrhoids.  Recommended recall in 10 years.  Referred to GI by PCP for evaluation of ongoing abdominal discomfort.  Labs 12/12/2023: Elevated glucose otherwise normal CMP, elevated lipase of 103, normal amylase.  CT A/P 12/07/2023 showed a small fat-containing umbilical hernia and rectus diastases, right lung base pulmonary nodules versus small foci with follow-up recommended, large stool throughout the colon.  Previously seen prominent ileocecal valve versus soft tissue was not visualized on this exam.   Patient states he has RLQ discomfort constantly, which has been present for years.  Denies worsening of pain over the years.  Pain primarily localized to RLQ but can radiate upward.  He is able to pinpoint location of pain with palpation.  States he has a bowel movement 1-2 times daily.  Occasionally has a sensation of incomplete evacuation.  When he does not exercise he ends up eating more and has more frequent bowel movements.  Denies any blood in his stool or melena.  Seems that having a bowel movement does somewhat alleviate his pain.  He denies any straining with bowel movements.  Stools typically solid but can be loose at times.  States he does get good fruits and vegetables in his diet.  States he takes Metamucil, has been taking for a while.  Previous GI Procedures/Imaging   CT A/P 12/07/2023 1. No acute  abnormality in the abdomen and pelvis. 2. Small fat containing broad-based umbilical hernia and rectus diastasis. 3. Right lung base pulmonary nodules versus small foci of subpleural atelectasis. Follow-up according to guidelines below to ensure stability. 4. Aortic Atherosclerosis (ICD10-I70.0). 5. Large stool throughout the colon.  Colonoscopy 04/30/2018 - Lipomatous ileocecal valve.  - The examined portion of the ileum was normal.  - Diverticulosis in the sigmoid colon, in the descending colon, in the transverse colon and in the ascending colon.  - Non- bleeding internal hemorrhoids.  - No specimens collected. - Recall 10 years   Past Medical History:  Diagnosis Date   Abdominal pain, chronic, right lower quadrant 05/28/2013   Chronic persistent, stable. Remains unclear etiology, seems functional with improvement after dietary changes, considered IBS - Chronic >30 yrs, Hx negative colonoscopy  Referred back to GI (Dr. Belvie Just - [facility]) - Last seen 09/08/14 - dx chronic RLQ abd pain, dysphagia, GERD. Reproducible pain on deep RLQ abd pain with radiation up to chest, thought to be due to MSK. Ordered Abd    Dysphagia 07/08/2020   Enlarged prostate on rectal examination 06/27/2019   History of hypertension 07/26/2016   Osteoarthritis    T2DM (type 2 diabetes mellitus) (HCC) 04/2010    Past Surgical History:  Procedure Laterality Date   TOTAL KNEE ARTHROPLASTY     Left    Current Outpatient Medications  Medication Sig Dispense Refill   aspirin 81 MG EC tablet Take 81 mg by mouth daily.     atorvastatin  (LIPITOR) 20 MG tablet TAKE 1 TABLET BY MOUTH ONCE  DAILY 100 tablet 2   ezetimibe (ZETIA) 10 MG tablet Take 10 mg by mouth daily.     glipiZIDE  (GLUCOTROL ) 5 MG tablet Take 5 mg by mouth daily before breakfast.     hydroxypropyl methylcellulose / hypromellose (ISOPTO TEARS / GONIOVISC) 2.5 % ophthalmic solution Place 1 drop into both eyes as needed for dry eyes.      losartan  (COZAAR ) 25 MG tablet TAKE 1 TABLET BY MOUTH AT  BEDTIME 90 tablet 3   metFORMIN  (GLUCOPHAGE ) 1000 MG tablet TAKE 1 TABLET BY MOUTH TWICE  DAILY WITH MEALS 200 tablet 2   Multiple Vitamins-Minerals (ICAPS AREDS 2 PO) Take 1 capsule by mouth daily.      Multiple Vitamins-Minerals (PRESERVISION AREDS 2+MULTI VIT PO) Take 1 capsule by mouth daily.     tamsulosin  (FLOMAX ) 0.4 MG CAPS capsule Take 0.4 mg by mouth daily.     TRULICITY  1.5 MG/0.5ML SOAJ INJECT THE CONTENTS OF ONE PEN  SUBCUTANEOUSLY WEEKLY AS  DIRECTED 6 mL 3   No current facility-administered medications for this visit.    Allergies as of 03/08/2024   (No Known Allergies)    Family History  Problem Relation Age of Onset   Colon cancer Neg Hx    Esophageal cancer Neg Hx    Stomach cancer Neg Hx    Pancreatic cancer Neg Hx     Social History   Tobacco Use   Smoking status: Never    Passive exposure: Never   Smokeless tobacco: Never  Vaping Use   Vaping status: Never Used  Substance Use Topics   Alcohol use: No   Drug use: No     Review of Systems:    Constitutional: No unintentional weight loss, fever, chills Cardiovascular: No chest pain Respiratory: No SOB  Gastrointestinal: See HPI and otherwise negative Hematologic: No bleeding or bruising   Physical Exam:  Vital signs: BP 110/60   Pulse (!) 106   Ht 5' 11 (1.803 m)   Wt 252 lb (114.3 kg)   BMI 35.15 kg/m   Constitutional: Pleasant, overweight male in NAD, alert and cooperative Head:  Normocephalic and atraumatic.  Eyes: No scleral icterus.  Respiratory: Respirations even and unlabored. Lungs clear to auscultation bilaterally.  No wheezes, crackles, or rhonchi.  Cardiovascular:  Regular rate and rhythm. No murmurs. No peripheral edema. Gastrointestinal:  Soft, nondistended, mildly tender to palpation of RLQ. No rebound or guarding. Normal bowel sounds. No appreciable masses or hepatomegaly. Neurologic:  Alert and oriented x4;  grossly  normal neurologically.  Skin:   Dry and intact without significant lesions or rashes. Psychiatric: Oriented to person, place and time. Demonstrates good judgement and reason without abnormal affect or behaviors.   RELEVANT LABS AND IMAGING: CBC    Component Value Date/Time   WBC 8.8 01/13/2018 2020   RBC 4.45 01/13/2018 2020   HGB 13.3 01/13/2018 2020   HGB 12.8 (L) 09/13/2017 1111   HCT 40.8 01/13/2018 2020   HCT 38.6 09/13/2017 1111   PLT 163 01/13/2018 2020   PLT 194 09/13/2017 1111   MCV 91.7 01/13/2018 2020   MCV 92 09/13/2017 1111   MCH 29.9 01/13/2018 2020   MCHC 32.6 01/13/2018 2020   RDW 13.3 01/13/2018 2020   RDW 14.8 09/13/2017 1111   LYMPHSABS 1.5 05/05/2010 0515   MONOABS 0.7 05/05/2010 0515   EOSABS 0.2 05/05/2010 0515   BASOSABS 0.0 05/05/2010 0515    CMP     Component Value Date/Time   NA 137 02/21/2023  1719   K 5.2 02/21/2023 1719   CL 96 02/21/2023 1719   CO2 24 02/21/2023 1719   GLUCOSE 135 (H) 02/21/2023 1719   GLUCOSE 109 (H) 01/13/2018 1906   BUN 15 02/21/2023 1719   CREATININE 1.00 02/21/2023 1719   CREATININE 1.17 08/18/2014 1637   CALCIUM  10.0 02/21/2023 1719   PROT 7.3 05/28/2013 1621   ALBUMIN 4.6 05/28/2013 1621   AST 36 05/28/2013 1621   ALT 38 05/28/2013 1621   ALKPHOS 67 05/28/2013 1621   BILITOT 0.3 05/28/2013 1621   GFRNONAA 59 (L) 04/27/2020 1631   GFRAA 68 04/27/2020 1631     Assessment/Plan:   Chronic RLQ pain Abnormal finding on GI tract imaging Patient seen today for evaluation of ongoing RLQ abdominal pain.  Was previously evaluated for this in 2019 following an abnormal CT finding of prominent ileocecal valve.  On colonoscopy 04/2018 was found to have a lipomatous ileocecal valve, diverticulosis, and nonbleeding internal hemorrhoids, with recommended recall in 10 years. Has continued to have persistent RLQ pain which is reproducible on palpation.   On recent CT A/P 11/2023 found to have a large stool burden, and  previously seen prominent ileocecal valve was not visualized. Patient denies any worsening of his pain but it has not gone away over the years.  He does have a bowel movement 1-2 times daily and does not feel constipated, but does endorse a sensation of incomplete evacuation occasionally.  Pain possibly alleviated by defecation.  He is taking Metamucil.  Consider whether constipation may be contributing to his chronic pain.  Findings on recent CT reassuring, though could consider repeat colonoscopy if symptoms do not improve.  - Advised to continue Metamucil, try increasing to twice daily - Consider adding 1/2-1 capful of Miralax daily vs low dose Linzess  - Labs today: CBC, CMP, lipase, TSH, ESR, CRP, TTG, IgA - Consider repeat colonoscopy   Camie Furbish, PA-C [facility] 03/08/2024, 3:47 PM  Patient Care Team: Diona Perkins, MD as PCP - General (Family Medicine)

## 2024-03-08 NOTE — Patient Instructions (Addendum)
 Your provider has requested that you go to the basement level for lab work before leaving today. Press B on the elevator. The lab is located at the first door on the left as you exit the elevator.  Due to recent changes in healthcare laws, you may see the results of your imaging and laboratory studies on MyChart before your provider has had a chance to review them.  We understand that in some cases there may be results that are confusing or concerning to you. Not all laboratory results come back in the same time frame and the provider may be waiting for multiple results in order to interpret others.  Please give us  48 hours in order for your provider to thoroughly review all the results before contacting the office for clarification of your results.    We have given you samples of the following medication to take: Linzess  72 mcg, take 1 tablet 30 minutes before breakfast.  Increase Metamucil dose to twice a day.  Thank you for trusting me with your gastrointestinal care!   Camie Furbish, PA-C  _______________________________________________________  If your blood pressure at your visit was 140/90 or greater, please contact your primary care physician to follow up on this.  _______________________________________________________  If you are age 20 or older, your body mass index should be between 23-30. Your Body mass index is 35.15 kg/m. If this is out of the aforementioned range listed, please consider follow up with your Primary Care Provider.  If you are age 58 or younger, your body mass index should be between 19-25. Your Body mass index is 35.15 kg/m. If this is out of the aformentioned range listed, please consider follow up with your Primary Care Provider.   ________________________________________________________  The [facility] providers would like to encourage you to use MYCHART to communicate with providers for non-urgent requests or questions.  Due to long hold times on the  telephone, sending your provider a message by [facility] may be a faster and more efficient way to get a response.  Please allow 48 business hours for a response.  Please remember that this is for non-urgent requests.  _______________________________________________________  [facility] is using a team-based approach to care.  Your team is made up of your doctor and two to three APPS. Our APPS (Nurse Practitioners and Physician Assistants) work with your physician to ensure care continuity for you. They are fully qualified to address your health concerns and develop a treatment plan. They communicate directly with your gastroenterologist to care for you. Seeing the Advanced Practice Practitioners on your physician's team can help you by facilitating care more promptly, often allowing for earlier appointments, access to diagnostic testing, procedures, and other specialty referrals.

## 2024-03-09 LAB — TISSUE TRANSGLUTAMINASE ABS,IGG,IGA
(tTG) Ab, IgA: <1 U/mL
(tTG) Ab, IgG: <1 U/mL

## 2024-03-11 ENCOUNTER — Encounter: Payer: Self-pay | Admitting: Gastroenterology

## 2024-03-12 ENCOUNTER — Ambulatory Visit: Payer: Self-pay | Admitting: Gastroenterology

## 2024-03-27 ENCOUNTER — Other Ambulatory Visit: Payer: Self-pay

## 2024-03-27 ENCOUNTER — Telehealth: Payer: Self-pay

## 2024-03-27 DIAGNOSIS — D649 Anemia, unspecified: Secondary | ICD-10-CM

## 2024-03-27 NOTE — Telephone Encounter (Signed)
-----   Message from Nurse Daphne D sent at 03/13/2024  9:56 AM EDT ----- Call pt to come in for blood work in 2 weeks from 10/01. Place orders. Recommend patient come back in a couple weeks to repeat CBC with addition of iron/ferritin, vitamin B12, folate.

## 2024-03-27 NOTE — Telephone Encounter (Signed)
 Attempted to reach pt to discuss coming in for repeat blood work. No answer, left vm for pt to return call.

## 2024-03-28 NOTE — Telephone Encounter (Signed)
 Pt returned call. Advised pt that it was time for him to come in for repeat blood work. Pt states that his insurance didn't cover the blood work last time and he ended up with a bill for $262. Pt states he would like to go to this PCP to see if they can draw the blood work. Did advise pt of which labs to request from PCP and he wrote them down and verbalized them back. Will notify provider as FYI.

## 2024-04-22 NOTE — Progress Notes (Deleted)
 Chief Complaint: Follow-up chronic right lower quadrant pain  HPI:    Mr. Calvin Newton is a 73 year old African-American male, known to Dr. Shila, with a past medical history as listed below including chronic right lower quadrant pain, dysphagia, hypertension, type 2 diabetes and multiple others, who returns to clinic today for follow-up of chronic right lower quadrant pain.    03/05/2018 office visit with Greig Corti, PA-C for abnormal CT finding the prominent ileocecal valve.    04/2018 colonoscopy with a lipomatous ileocecal valve, diverticulosis and nonbleeding internal hemorrhoids.  Recall placed for 10 years.    12/07/2023 CTAP with a small fat-containing umbilical hernia and rectus diastases, right lung base pulmonary nodules versus small foci, large stool throughout the colon.    12/12/2023 elevated glucose and otherwise normal CMP, elevated lipase 103 normal amylase.    03/08/2024 office visit discussed right lower quadrant discomfort constantly for years.  Movement 1-2 times a day.  Occasionally sense of incomplete evacuation.  Bowel movement somewhat alleviated pain.  He is on Metamucil.  Last visit started on fiber and MiraLAX.  Labs including CBC, CMP, lipase, TSH, ESR, CRP, TTG and IgA.    03/08/2024 CBC with a hemoglobin of 12.9 and otherwise normal, glucose 183 and otherwise normal.  Lipase elevated 102.  TSH, ESR, CRP and TTG all normal.  Patient told to come in for iron studies but did not want to pay for labs.   Past Medical History:  Diagnosis Date   Abdominal pain, chronic, right lower quadrant 05/28/2013   Chronic persistent, stable. Remains unclear etiology, seems functional with improvement after dietary changes, considered IBS - Chronic >30 yrs, Hx negative colonoscopy  Referred back to GI (Dr. Belvie Just - [facility]) - Last seen 09/08/14 - dx chronic RLQ abd pain, dysphagia, GERD. Reproducible pain on deep RLQ abd pain with radiation up to chest, thought to be due to  MSK. Ordered Abd    Dysphagia 07/08/2020   Enlarged prostate on rectal examination 06/27/2019   History of hypertension 07/26/2016   Osteoarthritis    T2DM (type 2 diabetes mellitus) (HCC) 04/2010    Past Surgical History:  Procedure Laterality Date   TOTAL KNEE ARTHROPLASTY     Left    Current Outpatient Medications  Medication Sig Dispense Refill   aspirin 81 MG EC tablet Take 81 mg by mouth daily.     atorvastatin  (LIPITOR) 20 MG tablet TAKE 1 TABLET BY MOUTH ONCE  DAILY 100 tablet 2   ezetimibe (ZETIA) 10 MG tablet Take 10 mg by mouth daily.     glipiZIDE  (GLUCOTROL ) 5 MG tablet Take 5 mg by mouth daily before breakfast.     hydroxypropyl methylcellulose / hypromellose (ISOPTO TEARS / GONIOVISC) 2.5 % ophthalmic solution Place 1 drop into both eyes as needed for dry eyes.     linaclotide  (LINZESS ) 72 MCG capsule Take 1 capsule daily before meals. 12 capsule 0   losartan  (COZAAR ) 25 MG tablet TAKE 1 TABLET BY MOUTH AT  BEDTIME 90 tablet 3   metFORMIN  (GLUCOPHAGE ) 1000 MG tablet TAKE 1 TABLET BY MOUTH TWICE  DAILY WITH MEALS 200 tablet 2   Multiple Vitamins-Minerals (ICAPS AREDS 2 PO) Take 1 capsule by mouth daily.      Multiple Vitamins-Minerals (PRESERVISION AREDS 2+MULTI VIT PO) Take 1 capsule by mouth daily.     tamsulosin  (FLOMAX ) 0.4 MG CAPS capsule Take 0.4 mg by mouth daily.     TRULICITY  1.5 MG/0.5ML SOAJ INJECT THE CONTENTS OF  ONE PEN  SUBCUTANEOUSLY WEEKLY AS  DIRECTED 6 mL 3   No current facility-administered medications for this visit.    Allergies as of 04/23/2024   (No Known Allergies)    Family History  Problem Relation Age of Onset   Colon cancer Neg Hx    Esophageal cancer Neg Hx    Stomach cancer Neg Hx    Pancreatic cancer Neg Hx     Social History   Socioeconomic History   Marital status: Single    Spouse name: Not on file   Number of children: 0   Years of education: 12   Highest education level: High school graduate  Occupational History     Employer: OTHER  Tobacco Use   Smoking status: Never    Passive exposure: Never   Smokeless tobacco: Never  Vaping Use   Vaping status: Never Used  Substance and Sexual Activity   Alcohol use: No   Drug use: No   Sexual activity: Not Currently  Other Topics Concern   Not on file  Social History Narrative   Patient lives alone.   Calvin Newton married and has no children.    Patient works on a mail truck 6 days per week.    Patient exercises for 60 minutes 6x per week.    Cardio and strength training.    Very active in church.    Social Drivers of Corporate Investment Banker Strain: Low Risk  (04/07/2023)   Overall Financial Resource Strain (CARDIA)    Difficulty of Paying Living Expenses: Not hard at all  Food Insecurity: No Food Insecurity (11/05/2021)   Hunger Vital Sign    Worried About Running Out of Food in the Last Year: Never true    Ran Out of Food in the Last Year: Never true  Transportation Needs: No Transportation Needs (04/07/2023)   PRAPARE - Administrator, Civil Service (Medical): No    Lack of Transportation (Non-Medical): No  Physical Activity: Sufficiently Active (04/07/2023)   Exercise Vital Sign    Days of Exercise per Week: 5 days    Minutes of Exercise per Session: 30 min  Stress: No Stress Concern Present (04/07/2023)   [employer] of Occupational Health - Occupational Stress Questionnaire    Feeling of Stress : Not at all  Social Connections: Moderately Integrated (04/07/2023)   Social Connection and Isolation Panel    Frequency of Communication with Friends and Family: More than three times a week    Frequency of Social Gatherings with Friends and Family: Three times a week    Attends Religious Services: More than 4 times per year    Active Member of Clubs or Organizations: Yes    Attends Banker Meetings: More than 4 times per year    Marital Status: Never married  Intimate Partner Violence: Not At Risk (04/07/2023)    Humiliation, Afraid, Rape, and Kick questionnaire    Fear of Current or Ex-Partner: No    Emotionally Abused: No    Physically Abused: No    Sexually Abused: No    Review of Systems:    Constitutional: No weight loss, fever, chills, weakness or fatigue HEENT: Eyes: No change in vision               Ears, Nose, Throat:  No change in hearing or congestion Skin: No rash or itching Cardiovascular: No chest pain, chest pressure or palpitations   Respiratory: No SOB or cough Gastrointestinal: See HPI and  otherwise negative Genitourinary: No dysuria or change in urinary frequency Neurological: No headache, dizziness or syncope Musculoskeletal: No new muscle or joint pain Hematologic: No bleeding or bruising Psychiatric: No history of depression or anxiety    Physical Exam:  Vital signs: There were no vitals taken for this visit.  Constitutional:   Pleasant Caucasian male appears to be in NAD, Well developed, Well nourished, alert and cooperative Head:  Normocephalic and atraumatic. Eyes:   PEERL, EOMI. No icterus. Conjunctiva pink. Ears:  Normal auditory acuity. Neck:  Supple Throat: Oral cavity and pharynx without inflammation, swelling or lesion.  Respiratory: Respirations even and unlabored. Lungs clear to auscultation bilaterally.   No wheezes, crackles, or rhonchi.  Cardiovascular: Normal S1, S2. No MRG. Regular rate and rhythm. No peripheral edema, cyanosis or pallor.  Gastrointestinal:  Soft, nondistended, nontender. No rebound or guarding. Normal bowel sounds. No appreciable masses or hepatomegaly. Rectal:  Not performed.  Msk:  Symmetrical without gross deformities. Without edema, no deformity or joint abnormality.  Neurologic:  Alert and  oriented x4;  grossly normal neurologically.  Skin:   Dry and intact without significant lesions or rashes. Psychiatric: Oriented to person, place and time. Demonstrates good judgement and reason without abnormal affect or  behaviors.  RELEVANT LABS AND IMAGING: CBC    Component Value Date/Time   WBC 6.0 03/08/2024 1623   RBC 4.29 03/08/2024 1623   HGB 12.9 (L) 03/08/2024 1623   HGB 12.8 (L) 09/13/2017 1111   HCT 38.9 (L) 03/08/2024 1623   HCT 38.6 09/13/2017 1111   PLT 188.0 03/08/2024 1623   PLT 194 09/13/2017 1111   MCV 90.7 03/08/2024 1623   MCV 92 09/13/2017 1111   MCH 29.9 01/13/2018 2020   MCHC 33.2 03/08/2024 1623   RDW 13.7 03/08/2024 1623   RDW 14.8 09/13/2017 1111   LYMPHSABS 1.3 03/08/2024 1623   MONOABS 0.3 03/08/2024 1623   EOSABS 0.1 03/08/2024 1623   BASOSABS 0.1 03/08/2024 1623    CMP     Component Value Date/Time   NA 138 03/08/2024 1623   NA 137 02/21/2023 1719   K 4.1 03/08/2024 1623   CL 105 03/08/2024 1623   CO2 25 03/08/2024 1623   GLUCOSE 183 (H) 03/08/2024 1623   BUN 19 03/08/2024 1623   BUN 15 02/21/2023 1719   CREATININE 1.02 03/08/2024 1623   CREATININE 1.17 08/18/2014 1637   CALCIUM  9.8 03/08/2024 1623   PROT 6.9 03/08/2024 1623   ALBUMIN 4.5 03/08/2024 1623   AST 28 03/08/2024 1623   ALT 31 03/08/2024 1623   ALKPHOS 60 03/08/2024 1623   BILITOT 0.4 03/08/2024 1623   GFRNONAA 59 (L) 04/27/2020 1631   GFRAA 68 04/27/2020 1631    Assessment: 1.  Chronic right lower quadrant pain: Previously evaluated CT which showed prominent ileocecal valve and evaluated with colonoscopy in 2019 with lipomatous ileocecal valve, diverticulosis and nonbleeding internal hemorrhoids with recall placed for 10 years, CTAP 11/2023 with large stool burden and did not see prominent ileocecal valve fragment last year as it attempted to come back constipation with Metamucil and MiraLAX  Plan: 1.  Consider repeat colonoscopy     Calvin Failing, PA-C [facility] 04/22/2024, 11:28 AM  Cc: Diona Perkins, MD

## 2024-04-23 ENCOUNTER — Ambulatory Visit: Admitting: Physician Assistant

## 2024-05-06 ENCOUNTER — Other Ambulatory Visit: Payer: Self-pay | Admitting: *Deleted

## 2024-05-08 MED ORDER — METFORMIN HCL 1000 MG PO TABS: 1000.0000 mg | ORAL_TABLET | Freq: Two times a day (BID) | ORAL | 2 refills | Status: AC

## 2024-05-08 MED ORDER — ATORVASTATIN CALCIUM 20 MG PO TABS: 20.0000 mg | ORAL_TABLET | Freq: Every day | ORAL | 2 refills | Status: AC

## 2024-05-08 NOTE — Telephone Encounter (Signed)
 Chart reviewed. Rx refilled. Requesting patient clinic visit soon.
